# Patient Record
Sex: Female | Born: 1980 | Race: Black or African American | Hispanic: No | Marital: Married | Smoking: Former smoker
Health system: Southern US, Community
[De-identification: ages and names within clinical notes are randomized; demographics above are authoritative.]

## PROBLEM LIST (undated history)

## (undated) ENCOUNTER — Inpatient Hospital Stay (HOSPITAL_COMMUNITY): Payer: Self-pay

## (undated) DIAGNOSIS — D649 Anemia, unspecified: Secondary | ICD-10-CM

## (undated) DIAGNOSIS — F419 Anxiety disorder, unspecified: Secondary | ICD-10-CM

## (undated) DIAGNOSIS — Z9109 Other allergy status, other than to drugs and biological substances: Secondary | ICD-10-CM

## (undated) DIAGNOSIS — B999 Unspecified infectious disease: Secondary | ICD-10-CM

## (undated) DIAGNOSIS — IMO0002 Reserved for concepts with insufficient information to code with codable children: Secondary | ICD-10-CM

## (undated) DIAGNOSIS — F329 Major depressive disorder, single episode, unspecified: Secondary | ICD-10-CM

## (undated) HISTORY — DX: Reserved for concepts with insufficient information to code with codable children: IMO0002

## (undated) HISTORY — PX: NO PAST SURGERIES: SHX2092

## (undated) HISTORY — DX: Unspecified infectious disease: B99.9

## (undated) HISTORY — PX: BARTHOLIN GLAND CYST EXCISION: SHX565

## (undated) HISTORY — DX: Anxiety disorder, unspecified: F41.9

## (undated) HISTORY — DX: Major depressive disorder, single episode, unspecified: F32.9

---

## 2009-02-05 DIAGNOSIS — IMO0002 Reserved for concepts with insufficient information to code with codable children: Secondary | ICD-10-CM

## 2009-02-05 HISTORY — DX: Reserved for concepts with insufficient information to code with codable children: IMO0002

## 2009-06-22 ENCOUNTER — Emergency Department (HOSPITAL_COMMUNITY): Admission: EM | Admit: 2009-06-22 | Discharge: 2009-06-22 | Payer: Self-pay | Admitting: Family Medicine

## 2009-07-05 ENCOUNTER — Emergency Department (HOSPITAL_COMMUNITY): Admission: EM | Admit: 2009-07-05 | Discharge: 2009-07-05 | Payer: Self-pay | Admitting: Emergency Medicine

## 2009-11-11 ENCOUNTER — Ambulatory Visit: Payer: Self-pay | Admitting: Family

## 2009-11-11 ENCOUNTER — Inpatient Hospital Stay (HOSPITAL_COMMUNITY): Admission: AD | Admit: 2009-11-11 | Discharge: 2009-11-11 | Payer: Self-pay | Admitting: Obstetrics & Gynecology

## 2010-02-05 DIAGNOSIS — F32A Depression, unspecified: Secondary | ICD-10-CM

## 2010-02-05 DIAGNOSIS — F419 Anxiety disorder, unspecified: Secondary | ICD-10-CM

## 2010-02-05 HISTORY — DX: Anxiety disorder, unspecified: F41.9

## 2010-02-05 HISTORY — DX: Depression, unspecified: F32.A

## 2010-02-05 NOTE — L&D Delivery Note (Signed)
Delivery Note At 5:58 PM a viable female was delivered via Vaginal, Spontaneous Delivery (Presentation: Right Occiput Anterior).  APGAR: 9, 9; weight 5 lb 9.8 oz (2545 g).   Placenta status: Intact, Spontaneous.  Cord: 3 vessels with the following complications: None.  Cord pH: N/a  Anesthesia: Epidural  Episiotomy: None Lacerations: 1st degree;Labial  No perineal lacerations. Suture Repair: None Est. Blood Loss (mL): 200  Mom to postpartum.  Baby to nursery-stable.  Corry Ihnen 10/28/2010, 2:11 AM

## 2010-02-19 ENCOUNTER — Inpatient Hospital Stay (HOSPITAL_COMMUNITY)
Admission: AD | Admit: 2010-02-19 | Discharge: 2010-02-19 | Payer: Self-pay | Source: Home / Self Care | Attending: Family Medicine | Admitting: Family Medicine

## 2010-02-20 LAB — POCT PREGNANCY, URINE: Preg Test, Ur: NEGATIVE

## 2010-02-22 LAB — WOUND CULTURE: Culture: NORMAL

## 2010-03-22 LAB — GC/CHLAMYDIA PROBE AMP, GENITAL
Chlamydia: NEGATIVE
Gonorrhea: NEGATIVE

## 2010-03-22 LAB — ANTIBODY SCREEN: Antibody Screen: NEGATIVE

## 2010-03-22 LAB — HIV ANTIBODY (ROUTINE TESTING W REFLEX): HIV: NONREACTIVE

## 2010-03-22 LAB — ABO/RH: RH Type: POSITIVE

## 2010-03-22 LAB — RPR: RPR: NONREACTIVE

## 2010-03-22 LAB — RUBELLA ANTIBODY, IGM: Rubella: IMMUNE

## 2010-04-21 ENCOUNTER — Inpatient Hospital Stay (HOSPITAL_COMMUNITY)
Admission: AD | Admit: 2010-04-21 | Discharge: 2010-04-21 | Disposition: A | Discharge status: Home / Self Care | Payer: 59 | Source: Ambulatory Visit | Attending: Obstetrics & Gynecology | Admitting: Obstetrics & Gynecology

## 2010-04-21 DIAGNOSIS — O2390 Unspecified genitourinary tract infection in pregnancy, unspecified trimester: Secondary | ICD-10-CM | POA: Insufficient documentation

## 2010-04-21 DIAGNOSIS — N751 Abscess of Bartholin's gland: Secondary | ICD-10-CM | POA: Insufficient documentation

## 2010-04-24 LAB — POCT PREGNANCY, URINE
Preg Test, Ur: NEGATIVE
Preg Test, Ur: NEGATIVE

## 2010-08-22 ENCOUNTER — Ambulatory Visit (HOSPITAL_BASED_OUTPATIENT_CLINIC_OR_DEPARTMENT_OTHER): Payer: 59 | Admitting: Psychology

## 2010-08-22 DIAGNOSIS — F4323 Adjustment disorder with mixed anxiety and depressed mood: Secondary | ICD-10-CM

## 2010-08-26 ENCOUNTER — Inpatient Hospital Stay (HOSPITAL_COMMUNITY)
Admission: AD | Admit: 2010-08-26 | Discharge: 2010-08-26 | Disposition: A | Discharge status: Home / Self Care | Payer: 59 | Source: Ambulatory Visit | Attending: Obstetrics and Gynecology | Admitting: Obstetrics and Gynecology

## 2010-08-26 ENCOUNTER — Encounter (HOSPITAL_COMMUNITY): Payer: Self-pay

## 2010-08-26 DIAGNOSIS — N750 Cyst of Bartholin's gland: Secondary | ICD-10-CM | POA: Insufficient documentation

## 2010-08-26 DIAGNOSIS — N751 Abscess of Bartholin's gland: Secondary | ICD-10-CM

## 2010-08-26 DIAGNOSIS — O2390 Unspecified genitourinary tract infection in pregnancy, unspecified trimester: Secondary | ICD-10-CM | POA: Insufficient documentation

## 2010-08-26 HISTORY — DX: Anemia, unspecified: D64.9

## 2010-08-26 LAB — URINALYSIS, ROUTINE W REFLEX MICROSCOPIC
Bilirubin Urine: NEGATIVE
Glucose, UA: NEGATIVE mg/dL
Hgb urine dipstick: NEGATIVE
Ketones, ur: >80 mg/dL — AB
Nitrite: POSITIVE — AB
Protein, ur: NEGATIVE mg/dL
Specific Gravity, Urine: 1.02 (ref 1.005–1.030)
Urobilinogen, UA: 1 mg/dL (ref 0.0–1.0)
pH: 7 (ref 5.0–8.0)

## 2010-08-26 LAB — URINE MICROSCOPIC-ADD ON

## 2010-08-26 MED ORDER — LIDOCAINE HCL 2 % EX GEL
Freq: Once | CUTANEOUS | Status: AC
  Administered 2010-08-26: 1 via TOPICAL
  Filled 2010-08-26: qty 20

## 2010-08-26 MED ORDER — HYDROCODONE-ACETAMINOPHEN 5-325 MG PO TABS: 1.0000 | ORAL_TABLET | Freq: Four times a day (QID) | ORAL | Status: AC | PRN

## 2010-08-26 MED ORDER — CEPHALEXIN 500 MG PO CAPS: 500.0000 mg | ORAL_CAPSULE | Freq: Three times a day (TID) | ORAL | Status: AC

## 2010-08-26 NOTE — Discharge Instructions (Signed)
Bartholin's Cyst And Abscess You have a Bartholin's gland cyst. Bartholin's glands produce mucus through small openings just outside the opening of the vagina. The mucus helps with lubrication around the vagina during sexual intercourse. If the duct becomes clogged, the gland will swell and cause a bulge on the inside of the vagina. If this becomes big enough, it can be seen and felt on the outside of the vagina as well. Sometimes, the swelling will shrink away by itself. However, if the cyst becomes infected, the Bartholin's cyst fills with pus and becomes more swollen, red and painful and becomes a Bartholin's abscess. This usually requires antibiotic treatment and surgical drainage. Sometimes, with minor surgery under local anesthesia, a small tube is placed in the cyst or abscess wall. This allows continued drainage for up to 6 weeks. Minor surgery can make a new opening to replace the clogged duct and help prevent future cysts or abscess. If the abscess occurs several times, a minor operation with local anesthesia is necessary to remove the Bartholin's gland completely or to make it drain better. Cutting open the gland and suturing the edges to make the opening of the gland bigger (marsupialization) may be needed and should usually be done by your obstetrician/gyncology physician. Antibiotics are usually prescribed for this condition. Take all antibiotics as prescribed. Make sure to finish them even if you are doing better. Take warm sitz baths for 20 minutes, 3 times a day. See your caregiver for follow-up care as recommended. SEEK MEDICAL CARE IF:  There is increasing pain, swelling and/or redness near the vagina.   There is vomiting or inability to tolerate medications.   You have an oral temperature above 102 F (38.9 C).   There is uncontrolled bleeding from the vagina.  Document Released: 03/01/2004 Document Re-Released: 04/18/2009 [facility] Patient Information 2011 [place], [place].

## 2010-08-26 NOTE — ED Provider Notes (Addendum)
History     Chief Complaint  Patient presents with  . Bartholin's Cyst   HPI Comments: Andrea Hanson is 30 y.o. AA at 27 weeks gest. She presents to Maternity with complaint of pain and swelling in the vaginal area. 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 Upon arrival the abscess began to drain a small amount.     The history is provided by the patient.    Past Medical History  Diagnosis Date  . Anemia     Past Surgical History  Procedure Date  . No past surgeries     No family history on file.  History  Substance Use Topics  . Smoking status: Former Games developer  . Smokeless tobacco: Never Used  . Alcohol Use: No    OB History    Grav Para Term Preterm Abortions TAB SAB Ect Mult Living   1               Review of Systems  Genitourinary: Positive for vaginal discharge.       [redacted] weeks gestation with abscess left bartholin gland.  All other systems reviewed and are negative.    Physical Exam  BP 112/65  Pulse 79  Temp(Src) 98 F (36.7 C) (Oral)  Resp 18  Ht 5' 5.5" (1.664 m)  Wt 141 lb (63.957 kg)  BMI 23.11 kg/m2  Physical Exam  Nursing note and vitals reviewed. Constitutional: She is oriented to person, place, and time. She appears well-developed and well-nourished.  Eyes: EOM are normal.  Neck: Neck supple.  Pulmonary/Chest: Effort normal.  Abdominal: Soft. There is no tenderness.       Gravid at [redacted] weeks gestation. Pt on EFM. Abdomen non tedner, size consistent with dates.  Genitourinary:    There is tenderness on the left labia. There is tenderness around the vagina.       Tender, swollen area with small amount of purulent drainage.    Musculoskeletal: Normal range of motion.  Neurological: She is alert and oriented to person, place, and time. No cranial nerve deficit.  Skin: Skin is warm and dry.    ED Course  Word catheter placed Date/Time: 08/26/2010 11:05 AM Performed by: Kerrie Buffalo Authorized by:  Arlyce Harman E Consent: Verbal consent obtained. Risks and benefits: risks, benefits and alternatives were discussed Consent given by: patient Patient understanding: patient states understanding of the procedure being performed Patient consent: the patient's understanding of the procedure matches consent given Procedure consent: procedure consent matches procedure scheduled Relevant documents: relevant documents present and verified Patient identity confirmed: verbally with patient, arm band and hospital-assigned identification number Time out: Immediately prior to procedure a "time out" was called to verify the correct patient, procedure, equipment, support staff and site/side marked as required. Preparation: Patient was prepped and draped in the usual sterile fashion. Local anesthesia used: yes Anesthesia: local infiltration Local anesthetic: topical anesthetic Patient sedated: no Patient tolerance: Patient tolerated the procedure well with no immediate complications.  INCISION AND DRAINAGE Date/Time: 08/26/2010 10:51 AM Performed by: Kerrie Buffalo Authorized by: Fortino Sic Consent: Verbal consent obtained. Risks and benefits: risks, benefits and alternatives were discussed Consent given by: patient Patient understanding: patient states understanding of the procedure being performed Patient consent: the patient's understanding of the procedure matches consent given Relevant documents: relevant documents present and verified Patient identity confirmed: verbally with patient, arm band and hospital-assigned identification number Time out: Immediately prior to procedure a "time out" was called  to verify the correct patient, procedure, equipment, support staff and site/side marked as required. Type: abscess Location: left bartholin's gland. Anesthesia: local infiltration Local anesthetic: topical anesthetic Patient sedated: no Scalpel size: 11 Needle gauge: 22 Incision type:  single straight Complexity: simple Drainage: bloody Drainage amount: moderate Wound treatment: drain placed Patient tolerance: Patient tolerated the procedure well with no immediate complications.    MDM Dx: Bartholin's abscess, Pregnancy Will treat with Keflex and pt. Will follow up with Dr. Neva Seat on Tuesday as scheduled. She will return here as needed.      [place], NP 08/26/10 [address] [place], NP 09/07/10 [id number]

## 2010-08-26 NOTE — Progress Notes (Signed)
Patient c/o Bartholin's cyst onset Thursday worse now, 29 weeks

## 2010-08-26 NOTE — ED Notes (Signed)
HNeese, NP at bedside, bartholin's cyst examined, pt voices understanding of need to plan ward catheter.

## 2010-08-26 NOTE — Progress Notes (Signed)
Pt has hx of bartholin's on left labia, was going to get gland removed when she found out she was pregnant. As pt got into MAU room, cyst ruptured.

## 2010-08-29 ENCOUNTER — Encounter (HOSPITAL_COMMUNITY): Payer: 59 | Admitting: Psychology

## 2010-09-07 ENCOUNTER — Encounter (HOSPITAL_COMMUNITY): Payer: 59 | Admitting: Psychology

## 2010-09-29 ENCOUNTER — Encounter (HOSPITAL_COMMUNITY): Payer: 59 | Admitting: Psychology

## 2010-10-20 LAB — STREP B DNA PROBE: GBS: POSITIVE

## 2010-10-26 ENCOUNTER — Encounter (HOSPITAL_COMMUNITY): Payer: Self-pay | Admitting: *Deleted

## 2010-10-26 ENCOUNTER — Inpatient Hospital Stay (HOSPITAL_COMMUNITY)
Admission: AD | Admit: 2010-10-26 | Discharge: 2010-10-26 | Disposition: A | Discharge status: Home / Self Care | Payer: 59 | Source: Ambulatory Visit | Attending: Obstetrics and Gynecology | Admitting: Obstetrics and Gynecology

## 2010-10-26 DIAGNOSIS — O479 False labor, unspecified: Secondary | ICD-10-CM | POA: Insufficient documentation

## 2010-10-26 HISTORY — DX: Other allergy status, other than to drugs and biological substances: Z91.09

## 2010-10-26 MED ORDER — BUTORPHANOL TARTRATE 2 MG/ML IJ SOLN
1.0000 mg | Freq: Once | INTRAMUSCULAR | Status: AC
  Administered 2010-10-26: 1 mg via INTRAMUSCULAR
  Filled 2010-10-26: qty 1

## 2010-10-26 MED ORDER — PROMETHAZINE HCL 25 MG/ML IJ SOLN
12.5000 mg | Freq: Once | INTRAMUSCULAR | Status: AC
  Administered 2010-10-26: 12.5 mg via INTRAMUSCULAR
  Filled 2010-10-26: qty 1

## 2010-10-26 NOTE — Progress Notes (Signed)
Dr. Dion Body put in orders for pain management. DC patient home following medication

## 2010-10-26 NOTE — Progress Notes (Signed)
Contractions started around 0530, getting closer and stronger.

## 2010-10-26 NOTE — Progress Notes (Signed)
Pt G1 at 37.6wks, having contractions all day.  Pt seen earlier today in MAU SVE 3cm.  Contractions are stronger and more intense now.

## 2010-10-26 NOTE — Progress Notes (Signed)
Pt was here earlier for labor eval. Pt presents back to MAU for labor evaluation.

## 2010-10-27 ENCOUNTER — Encounter (HOSPITAL_COMMUNITY): Payer: Self-pay | Admitting: Anesthesiology

## 2010-10-27 ENCOUNTER — Encounter (HOSPITAL_COMMUNITY): Payer: Self-pay

## 2010-10-27 ENCOUNTER — Inpatient Hospital Stay (HOSPITAL_COMMUNITY): Payer: 59 | Admitting: Anesthesiology

## 2010-10-27 ENCOUNTER — Inpatient Hospital Stay (HOSPITAL_COMMUNITY)
Admission: AD | Admit: 2010-10-27 | Discharge: 2010-10-30 | DRG: 775 | Disposition: A | Discharge status: Home / Self Care | Payer: 59 | Source: Ambulatory Visit | Attending: Obstetrics and Gynecology | Admitting: Obstetrics and Gynecology

## 2010-10-27 DIAGNOSIS — Z22330 Carrier of Group B streptococcus: Secondary | ICD-10-CM

## 2010-10-27 DIAGNOSIS — O99892 Other specified diseases and conditions complicating childbirth: Secondary | ICD-10-CM | POA: Diagnosis present

## 2010-10-27 LAB — RPR: RPR Ser Ql: NONREACTIVE

## 2010-10-27 LAB — CBC
MCHC: 33.5 g/dL (ref 30.0–36.0)
Platelets: 290 10*3/uL (ref 150–400)
RDW: 15.7 % — ABNORMAL HIGH (ref 11.5–15.5)

## 2010-10-27 MED ORDER — PRENATAL PLUS 27-1 MG PO TABS
1.0000 | ORAL_TABLET | Freq: Every day | ORAL | Status: DC
  Administered 2010-10-28 – 2010-10-29 (×2): 1 via ORAL
  Filled 2010-10-27 (×2): qty 1

## 2010-10-27 MED ORDER — PENICILLIN G POTASSIUM 5000000 UNITS IJ SOLR
5.0000 10*6.[IU] | Freq: Once | INTRAVENOUS | Status: DC
  Administered 2010-10-27: 5 10*6.[IU] via INTRAVENOUS
  Filled 2010-10-27: qty 5

## 2010-10-27 MED ORDER — SIMETHICONE 80 MG PO CHEW: 80.0000 mg | CHEWABLE_TABLET | ORAL | Status: DC | PRN

## 2010-10-27 MED ORDER — LIDOCAINE HCL 1.5 % IJ SOLN
INTRAMUSCULAR | Status: DC | PRN
  Administered 2010-10-27 (×2): 5 mL via EPIDURAL

## 2010-10-27 MED ORDER — IBUPROFEN 600 MG PO TABS
600.0000 mg | ORAL_TABLET | Freq: Four times a day (QID) | ORAL | Status: DC | PRN
  Filled 2010-10-27: qty 1

## 2010-10-27 MED ORDER — IBUPROFEN 600 MG PO TABS
600.0000 mg | ORAL_TABLET | Freq: Four times a day (QID) | ORAL | Status: DC
  Administered 2010-10-27 – 2010-10-30 (×11): 600 mg via ORAL
  Filled 2010-10-27 (×9): qty 1

## 2010-10-27 MED ORDER — ACETAMINOPHEN 325 MG PO TABS: 650.0000 mg | ORAL_TABLET | ORAL | Status: DC | PRN

## 2010-10-27 MED ORDER — PHENYLEPHRINE 40 MCG/ML (10ML) SYRINGE FOR IV PUSH (FOR BLOOD PRESSURE SUPPORT)
PREFILLED_SYRINGE | INTRAVENOUS | Status: AC
  Filled 2010-10-27: qty 5

## 2010-10-27 MED ORDER — ONDANSETRON HCL 4 MG PO TABS: 4.0000 mg | ORAL_TABLET | ORAL | Status: DC | PRN

## 2010-10-27 MED ORDER — EPHEDRINE 5 MG/ML INJ
INTRAVENOUS | Status: AC
  Filled 2010-10-27: qty 4

## 2010-10-27 MED ORDER — FENTANYL 2.5 MCG/ML BUPIVACAINE 1/10 % EPIDURAL INFUSION (WH - ANES)
14.0000 mL/h | INTRAMUSCULAR | Status: DC
  Administered 2010-10-27 (×2): 14 mL/h via EPIDURAL
  Filled 2010-10-27: qty 60

## 2010-10-27 MED ORDER — LANOLIN HYDROUS EX OINT: TOPICAL_OINTMENT | CUTANEOUS | Status: DC | PRN

## 2010-10-27 MED ORDER — ONDANSETRON HCL 4 MG/2ML IJ SOLN: 4.0000 mg | Freq: Four times a day (QID) | INTRAMUSCULAR | Status: DC | PRN

## 2010-10-27 MED ORDER — CITRIC ACID-SODIUM CITRATE 334-500 MG/5ML PO SOLN: 30.0000 mL | ORAL | Status: DC | PRN

## 2010-10-27 MED ORDER — FERROUS SULFATE 325 (65 FE) MG PO TABS
325.0000 mg | ORAL_TABLET | Freq: Two times a day (BID) | ORAL | Status: DC
  Administered 2010-10-28 – 2010-10-29 (×4): 325 mg via ORAL
  Filled 2010-10-27 (×4): qty 1

## 2010-10-27 MED ORDER — LACTATED RINGERS IV SOLN
INTRAVENOUS | Status: DC
  Administered 2010-10-27 (×2): via INTRAVENOUS

## 2010-10-27 MED ORDER — LACTATED RINGERS IV SOLN: 500.0000 mL | Freq: Once | INTRAVENOUS | Status: DC

## 2010-10-27 MED ORDER — OXYTOCIN 20 UNITS IN LACTATED RINGERS INFUSION - SIMPLE
125.0000 mL/h | Freq: Once | INTRAVENOUS | Status: DC
  Administered 2010-10-27: 999 mL/h via INTRAVENOUS

## 2010-10-27 MED ORDER — DIBUCAINE 1 % RE OINT: 1.0000 "application " | TOPICAL_OINTMENT | RECTAL | Status: DC | PRN

## 2010-10-27 MED ORDER — BUTORPHANOL TARTRATE 2 MG/ML IJ SOLN: 1.0000 mg | INTRAMUSCULAR | Status: DC | PRN

## 2010-10-27 MED ORDER — PHENYLEPHRINE 40 MCG/ML (10ML) SYRINGE FOR IV PUSH (FOR BLOOD PRESSURE SUPPORT)
80.0000 ug | PREFILLED_SYRINGE | INTRAVENOUS | Status: DC | PRN
  Filled 2010-10-27: qty 5

## 2010-10-27 MED ORDER — OXYCODONE-ACETAMINOPHEN 5-325 MG PO TABS
1.0000 | ORAL_TABLET | ORAL | Status: DC | PRN
  Administered 2010-10-28 (×2): 1 via ORAL
  Filled 2010-10-27 (×2): qty 1

## 2010-10-27 MED ORDER — ONDANSETRON HCL 4 MG/2ML IJ SOLN: 4.0000 mg | INTRAMUSCULAR | Status: DC | PRN

## 2010-10-27 MED ORDER — LIDOCAINE HCL (PF) 1 % IJ SOLN
30.0000 mL | INTRAMUSCULAR | Status: DC | PRN
  Filled 2010-10-27 (×2): qty 30

## 2010-10-27 MED ORDER — TETANUS-DIPHTH-ACELL PERTUSSIS 5-2.5-18.5 LF-MCG/0.5 IM SUSP
0.5000 mL | Freq: Once | INTRAMUSCULAR | Status: AC
  Administered 2010-10-28: 0.5 mL via INTRAMUSCULAR
  Filled 2010-10-27: qty 0.5

## 2010-10-27 MED ORDER — BENZOCAINE-MENTHOL 20-0.5 % EX AERO
INHALATION_SPRAY | CUTANEOUS | Status: AC
  Administered 2010-10-27: 1 via TOPICAL
  Filled 2010-10-27: qty 56

## 2010-10-27 MED ORDER — METHYLERGONOVINE MALEATE 0.2 MG/ML IJ SOLN: 0.2000 mg | INTRAMUSCULAR | Status: DC | PRN

## 2010-10-27 MED ORDER — MAGNESIUM HYDROXIDE 400 MG/5ML PO SUSP: 30.0000 mL | ORAL | Status: DC | PRN

## 2010-10-27 MED ORDER — DIPHENHYDRAMINE HCL 50 MG/ML IJ SOLN: 12.5000 mg | INTRAMUSCULAR | Status: DC | PRN

## 2010-10-27 MED ORDER — EPHEDRINE 5 MG/ML INJ
10.0000 mg | INTRAVENOUS | Status: DC | PRN
  Filled 2010-10-27: qty 4

## 2010-10-27 MED ORDER — BENZOCAINE-MENTHOL 20-0.5 % EX AERO
1.0000 "application " | INHALATION_SPRAY | CUTANEOUS | Status: DC | PRN
  Administered 2010-10-27: 1 via TOPICAL

## 2010-10-27 MED ORDER — OXYTOCIN BOLUS FROM INFUSION
500.0000 mL | Freq: Once | INTRAVENOUS | Status: DC
  Filled 2010-10-27: qty 500
  Filled 2010-10-27: qty 1000

## 2010-10-27 MED ORDER — FLEET ENEMA 7-19 GM/118ML RE ENEM: 1.0000 | ENEMA | RECTAL | Status: DC | PRN

## 2010-10-27 MED ORDER — FENTANYL 2.5 MCG/ML BUPIVACAINE 1/10 % EPIDURAL INFUSION (WH - ANES)
INTRAMUSCULAR | Status: AC
  Filled 2010-10-27: qty 60

## 2010-10-27 MED ORDER — OXYCODONE-ACETAMINOPHEN 5-325 MG PO TABS: 2.0000 | ORAL_TABLET | ORAL | Status: DC | PRN

## 2010-10-27 MED ORDER — PENICILLIN G POTASSIUM 5000000 UNITS IJ SOLR
2.5000 10*6.[IU] | INTRAVENOUS | Status: DC
  Administered 2010-10-27: 2.5 10*6.[IU] via INTRAVENOUS
  Filled 2010-10-27 (×7): qty 2.5

## 2010-10-27 MED ORDER — DIPHENHYDRAMINE HCL 25 MG PO CAPS
25.0000 mg | ORAL_CAPSULE | Freq: Four times a day (QID) | ORAL | Status: DC | PRN
  Administered 2010-10-29: 25 mg via ORAL
  Filled 2010-10-27: qty 1

## 2010-10-27 MED ORDER — SENNOSIDES-DOCUSATE SODIUM 8.6-50 MG PO TABS
2.0000 | ORAL_TABLET | Freq: Every day | ORAL | Status: DC
  Administered 2010-10-27: 2 via ORAL

## 2010-10-27 MED ORDER — METHYLERGONOVINE MALEATE 0.2 MG PO TABS: 0.2000 mg | ORAL_TABLET | ORAL | Status: DC | PRN

## 2010-10-27 MED ORDER — WITCH HAZEL-GLYCERIN EX PADS: 1.0000 "application " | MEDICATED_PAD | CUTANEOUS | Status: DC | PRN

## 2010-10-27 MED ORDER — LACTATED RINGERS IV SOLN: 500.0000 mL | INTRAVENOUS | Status: DC | PRN

## 2010-10-27 MED ORDER — OXYTOCIN 20 UNITS IN LACTATED RINGERS INFUSION - SIMPLE: 125.0000 mL/h | INTRAVENOUS | Status: DC | PRN

## 2010-10-27 NOTE — H&P (Signed)
Andrea Hanson is a 30 y.o. female presenting for Labor .  OB History    Grav Para Term Preterm Abortions TAB SAB Ect Mult Living   1 0 0 0 0 0 0 0 0 0      Past Medical History  Diagnosis Date  . Anemia   . Environmental allergies    Past Surgical History  Procedure Date  . Bartholin gland cyst excision   . No past surgeries    Family History: family history is not on file. Social History:  reports that she has quit smoking. She has never used smokeless tobacco. She reports that she does not drink alcohol or use illicit drugs. ROS negative except as stated in HPI  Allergies betadine and shellfish  Dilation: 6 Effacement (%): 100 Station: +1 Exam by:: Montez Morita, RNC Blood pressure 128/75, pulse 80, temperature 98.7 F (37.1 C), temperature source Oral, resp. rate 18, height 5\' 6"  (1.676 m), weight 70.308 kg (155 lb), SpO2 100.00%. @IPILAEXAM @ @PHYSEXAMBYAGE2 @  Prenatal labs: ABO, Rh: O/Positive/-- (02/15 0000) Antibody: Negative (02/15 0000) Rubella:   RPR: NON REACTIVE (09/21 1215)  HBsAg:    HIV: Non-reactive (02/15 0000)  GBS: Positive (09/14 0000)   Assessment/Plan: 38 wks 0 days Labor  GBS positive  Received penicillin Anticipate svd.. Dr. Dion Body covering this evening.    Andrea Hanson J. 10/27/2010, 5:07 PM

## 2010-10-27 NOTE — Anesthesia Procedure Notes (Signed)
Epidural Patient location during procedure: OB Start time: 10/27/2010 1:44 PM  Staffing Performed by: anesthesiologist   Preanesthetic Checklist Completed: patient identified, site marked, surgical consent, pre-op evaluation, timeout performed, IV checked, risks and benefits discussed and monitors and equipment checked  Epidural Patient position: sitting Prep: ChloraPrep and site prepped and draped Patient monitoring: continuous pulse ox and blood pressure Approach: midline Injection technique: LOR air and LOR saline  Needle:  Needle type: Tuohy  Needle gauge: 17 G Needle length: 9 cm Needle insertion depth: 5 cm cm Catheter type: closed end flexible Catheter size: 19 Gauge Catheter at skin depth: 10 cm Test dose: negative  Assessment Events: blood not aspirated, injection not painful, no injection resistance, negative IV test and no paresthesia  Additional Notes Patient identified.  Risk benefits discussed including failed block, incomplete pain control, headache, nerve damage, paralysis, blood pressure changes, nausea, vomiting, reactions to medication both toxic or allergic, and postpartum back pain.  Patient expressed understanding and wished to proceed.  All questions were answered.  Sterile technique used throughout procedure and epidural site dressed with sterile barrier dressing. No paresthesia or other complications noted.The patient did not experience any signs of intravascular injection such as tinnitus or metallic taste in mouth nor signs of intrathecal spread such as rapid motor block. Please see nursing notes for vital signs.

## 2010-10-27 NOTE — Anesthesia Preprocedure Evaluation (Signed)
Anesthesia Evaluation  Name, MR# and DOB Patient awake  General Assessment Comment  Reviewed: Allergy & Precautions, H&P , Patient's Chart, lab work & pertinent test results  Airway Mallampati: II TM Distance: >3 FB Neck ROM: full    Dental No notable dental hx.    Pulmonary  clear to auscultation  pulmonary exam normalPulmonary Exam Normal breath sounds clear to auscultation none    Cardiovascular regular Normal    Neuro/Psych Negative Neurological ROS  Negative Psych ROS  GI/Hepatic/Renal negative GI ROS  negative Liver ROS  negative Renal ROS        Endo/Other  Negative Endocrine ROS (+)      Abdominal   Musculoskeletal   Hematology negative hematology ROS (+)   Peds  Reproductive/Obstetrics (+) Pregnancy    Anesthesia Other Findings             Anesthesia Physical Anesthesia Plan  ASA: II  Anesthesia Plan: Epidural   Post-op Pain Management:    Induction:   Airway Management Planned:   Additional Equipment:   Intra-op Plan:   Post-operative Plan:   Informed Consent: I have reviewed the patients History and Physical, chart, labs and discussed the procedure including the risks, benefits and alternatives for the proposed anesthesia with the patient or authorized representative who has indicated his/her understanding and acceptance.     Plan Discussed with:   Anesthesia Plan Comments:         Anesthesia Quick Evaluation  

## 2010-10-28 LAB — CBC
HCT: 26.7 % — ABNORMAL LOW (ref 36.0–46.0)
MCH: 28.1 pg (ref 26.0–34.0)
MCHC: 34.1 g/dL (ref 30.0–36.0)
MCV: 82.4 fL (ref 78.0–100.0)
Platelets: 245 10*3/uL (ref 150–400)
RDW: 15.5 % (ref 11.5–15.5)
WBC: 11 10*3/uL — ABNORMAL HIGH (ref 4.0–10.5)

## 2010-10-28 NOTE — Anesthesia Postprocedure Evaluation (Signed)
  Anesthesia Post-op Note  Patient: Andrea Hanson  Procedure(s) Performed: * No procedures listed *  Patient Location: PACU and Mother/Baby  Anesthesia Type: Epidural  Level of Consciousness: awake, alert  and oriented  Airway and Oxygen Therapy: Patient Spontanous Breathing   Post-op Assessment: Patient's Cardiovascular Status Stable and Respiratory Function Stable  Post-op Vital Signs: stable  Complications: No apparent anesthesia complications

## 2010-10-28 NOTE — Discharge Summary (Signed)
Obstetric Discharge Summary Reason for Admission: onset of labor Prenatal Procedures: ultrasound Intrapartum Procedures: spontaneous vaginal delivery Postpartum Procedures: none Complications-Operative and Postpartum:   ANAPHyLACTIC REACTION, ETIOLOGY UNKNOWN           Rapid response team managed pt.  Given IV Prednisone, Benadryl, Famotidine x 24 hours Hemoglobin  Date Value Range Status  10/27/2010 10.4* 12.0-15.0 (g/dL) Final     HCT  Date Value Range Status  10/27/2010 31.0* 36.0-46.0 (%) Final    Discharge Diagnoses: Term Pregnancy-delivered  Discharge Information: Date: 10/28/2010 Activity: pelvic rest Diet: routine Medications: PNV, Ibuprophen and Iron, Epipen Condition: stable Instructions: refer to practice specific booklet Discharge to: home  Follow-up:  [facility] in 2 weeks due to h/o Depression.  Allergy appt ASAP. Newborn Data: Live born female  Birth Weight: 5 lb 9.8 oz (2545 g) APGAR: 9, 9  Home with mother.  Andrea Hanson 10/28/2010, 2:28 AM

## 2010-10-28 NOTE — Progress Notes (Signed)
Post Partum Day 1 Subjective: no complaints Bleeding increases periodically, mostly with nursing.  Baby doing well.  Objective: Blood pressure 108/74, pulse 79, temperature 98.3 F (36.8 C), temperature source Oral, resp. rate 20, height 5\' 6"  (1.676 m), weight 70.308 kg (155 lb), SpO2 99.00%, unknown if currently breastfeeding.  Physical Exam:  General: alert and cooperative Lochia: appropriate Uterine Fundus: firm Incision: N/a DVT Evaluation: No evidence of DVT seen on physical exam.   Basename 10/28/10 0529 10/27/10 1215  HGB 9.1* 10.4*  HCT 26.7* 31.0*    Assessment/Plan: Plan for discharge tomorrow and Breastfeeding S/p NSVD doing well.   LOS: 1 day   Indiyah Paone 10/28/2010, 2:37 PM

## 2010-10-29 MED ORDER — HYDROCORTISONE SOD SUCCINATE 100 MG IJ SOLR
100.0000 mg | Freq: Three times a day (TID) | INTRAMUSCULAR | Status: AC
  Administered 2010-10-29 (×2): 100 mg via INTRAVENOUS
  Filled 2010-10-29 (×2): qty 2

## 2010-10-29 MED ORDER — HYDROCORTISONE SOD SUCCINATE 100 MG IJ SOLR
100.0000 mg | Freq: Once | INTRAMUSCULAR | Status: AC
  Administered 2010-10-29: 100 mg via INTRAVENOUS
  Filled 2010-10-29: qty 2

## 2010-10-29 MED ORDER — FAMOTIDINE IN NACL 20-0.9 MG/50ML-% IV SOLN
20.0000 mg | Freq: Once | INTRAVENOUS | Status: AC
  Administered 2010-10-29: 20 mg via INTRAVENOUS
  Filled 2010-10-29: qty 50

## 2010-10-29 MED ORDER — FAMOTIDINE IN NACL 20-0.9 MG/50ML-% IV SOLN
20.0000 mg | Freq: Four times a day (QID) | INTRAVENOUS | Status: AC
  Administered 2010-10-29 (×3): 20 mg via INTRAVENOUS
  Filled 2010-10-29 (×3): qty 50

## 2010-10-29 MED ORDER — DIPHENHYDRAMINE HCL 50 MG/ML IJ SOLN
INTRAMUSCULAR | Status: AC
  Administered 2010-10-29: 50 mg via INTRAVENOUS
  Filled 2010-10-29: qty 1

## 2010-10-29 MED ORDER — DIPHENHYDRAMINE HCL 50 MG/ML IJ SOLN
50.0000 mg | Freq: Four times a day (QID) | INTRAMUSCULAR | Status: AC
  Administered 2010-10-29 (×3): 50 mg via INTRAVENOUS
  Filled 2010-10-29 (×3): qty 1

## 2010-10-29 NOTE — Progress Notes (Signed)
  Lips remain swollen and eyelids edematous. Denies itching, rash, or difficulty swallowing.

## 2010-10-29 NOTE — Progress Notes (Signed)
Rapid Response call received at 0100.  Arrived to pt room at 01:04.  Pt noted to have significant facial swelling around nose, lips, and in arms/hands.  Pt then began to breakout in hives noted diffusely over upper torso, breasts, legs and feet.  Dr. Rodman Pickle (anesthesia) called to come assess pt.  Orders received.  After initial round of IV meds given, pt states she feels much better and hives dissipated.  Vital signs remain stable. Will follow-up prior to change of shift and in 12 hours.

## 2010-10-29 NOTE — Progress Notes (Signed)
Post Partum Day 2 Subjective: Overnight, pt experienced anaphalaxis.  Etiology unknown.  Significant swelling in face, hands.  Hives on upper torso.  Rapid response team called led by Dr. Rodman Pickle, Anesthesiologist.  Pt responded well to IV Benadryl, Prednisone and Famotidine.  Pt doing much better this am.  No ob complaints.  Objective: Blood pressure 111/78, pulse 86, temperature 98 F (36.7 C), temperature source Oral, resp. rate 18, height 5\' 6"  (1.676 m), weight 70.308 kg (155 lb), SpO2 98.00%, unknown if currently breastfeeding.  Physical Exam:  General: alert and cooperative Lochia: appropriate Uterine Fundus: firm Incision: N/a DVT Evaluation: No evidence of DVT seen on physical exam. HEENT:  Periorbital edema bilaterally.  Lips appear slightly swollen.  Basename 10/28/10 0529 10/27/10 1215  HGB 9.1* 10.4*  HCT 26.7* 31.0*    Assessment/Plan: Plan for discharge tomorrow Pt with anaphylactic reaction overnight. Unsure of etiology.Anesthesia recommends IV meds for 24 hours which will be up tomorrow around 1 am.   Will discharge home with Epi pen.  LOS: 2 days   Andrea Hanson 10/29/2010, 8:56 AM

## 2010-10-29 NOTE — Consults (Signed)
Called by Rapid Response to bedside in room 115 for patient with allergic reaction of unknown cause.  Patient is s/p SVD on 10/27/2010 and has received only motrin and percocet today, both of which she has received before without reaction. She is known to have a shellfish allergy and has eaten both take-out food and cafeteria food today.  She is complaining of throat tightness, but no dyspnea or dysphonia.  Has been given 25 mg PO Benadryl and does not have an IV.    Patient found to be in no acute distress, but with massive swelling of the face and hives over entire body surface.  BP 142/75, HR 126, spo2 100% on RA.  Opens mouth well, MP I airway, no tongue swelling.  Lungs are CTAB, heart is regular and tachycardiac.    I placed an 18 ga PIV in left hand.  Ordered 50 mg IV Benadryl, 20 mg IV famotidine and 100 mg IV hydrocortisone.  Patient showed improvement in swelling and hives within 5 minutes of IV benadryl dose, and reported decreased throat tightness .    Recommend continue benadryl and pepcid every 6 hours and hydrocortisone every 8 hours for the next 24 hours.    Jasmine December, MD

## 2010-10-30 MED ORDER — EPINEPHRINE 0.3 MG/0.3ML IJ DEVI: 0.3000 mg | Freq: Once | INTRAMUSCULAR | Status: DC

## 2010-10-30 MED ORDER — IBUPROFEN 600 MG PO TABS: 600.0000 mg | ORAL_TABLET | Freq: Four times a day (QID) | ORAL | Status: AC

## 2010-10-30 NOTE — Progress Notes (Addendum)
Post Partum Day 3 Subjective: no complaints Feels better.  H/o depression requiring meds.  Off meds about 2 years prior to getting pregnant. Objective: Blood pressure 124/86, pulse 87, temperature 98.1 F (36.7 C), temperature source Oral, resp. rate 18, height 5\' 6"  (1.676 m), weight 70.308 kg (155 lb), SpO2 98.00%, unknown if currently breastfeeding.  Physical Exam:  General: alert and cooperative Lochia: appropriate Uterine Fundus: firm Incision: N/a DVT Evaluation: No evidence of DVT seen on physical exam. Periorbital edema resolved.  Basename 10/28/10 0529 10/27/10 1215  HGB 9.1* 10.4*  HCT 26.7* 31.0*    Assessment/Plan: Discharge home and Breastfeeding H/o Depression- Recommend 2 week f/u at [facility].  PP depression precautions given. Anaphylactic reaction-Will attempt to get an appt for pt prior to discharge.   LOS: 3 days   Andrea Hanson 10/30/2010, 8:11 AM    Addendum:10/30/2010, 08:27-  Left message with Allergist on call.  Will check out to [facility] to make allergy consult/appointment.

## 2010-11-16 ENCOUNTER — Inpatient Hospital Stay (HOSPITAL_COMMUNITY)
Admission: AD | Admit: 2010-11-16 | Discharge: 2010-11-16 | Disposition: A | Discharge status: Home / Self Care | Payer: Medicaid Other | Source: Ambulatory Visit | Attending: Obstetrics and Gynecology | Admitting: Obstetrics and Gynecology

## 2010-11-16 DIAGNOSIS — IMO0001 Reserved for inherently not codable concepts without codable children: Secondary | ICD-10-CM

## 2010-11-16 DIAGNOSIS — L509 Urticaria, unspecified: Secondary | ICD-10-CM | POA: Insufficient documentation

## 2010-11-16 DIAGNOSIS — L500 Allergic urticaria: Secondary | ICD-10-CM

## 2010-11-16 MED ORDER — DIPHENHYDRAMINE HCL 50 MG/ML IJ SOLN
50.0000 mg | Freq: Once | INTRAMUSCULAR | Status: AC
  Administered 2010-11-16: 50 mg via INTRAVENOUS
  Filled 2010-11-16: qty 1

## 2010-11-16 MED ORDER — LACTATED RINGERS IV BOLUS (SEPSIS): 1000.0000 mL | Freq: Once | INTRAVENOUS | Status: DC

## 2010-11-16 MED ORDER — FAMOTIDINE IN NACL 20-0.9 MG/50ML-% IV SOLN
20.0000 mg | Freq: Once | INTRAVENOUS | Status: AC
  Administered 2010-11-16: 20 mg via INTRAVENOUS
  Filled 2010-11-16: qty 50

## 2010-11-16 MED ORDER — METHYLPREDNISOLONE SODIUM SUCC 125 MG IJ SOLR
125.0000 mg | Freq: Once | INTRAMUSCULAR | Status: AC
  Administered 2010-11-16: 125 mg via INTRAVENOUS
  Filled 2010-11-16: qty 2

## 2010-11-16 NOTE — Progress Notes (Signed)
Pt skin appears clear at this time. Hives have resolved. Pt denies itching at this time and is comfortable going home.

## 2010-11-16 NOTE — Progress Notes (Signed)
Pt states that rt before 2300-she started breaking out in welts all over tht itch-she took 3 Bendadryl-unsure if each was 25mg  or 12.5mg 

## 2010-11-16 NOTE — ED Provider Notes (Signed)
History   Pt presents today c/o an allergic reaction. She states she recently delivered a baby in September and had a severe allergic reaction while she was in the hospital. She is allergic to betadine and shellfish. She states she even saw an allergist after dc and was not told she was allergic to anything else. She states she began to break out in hives again today and is feeling "tight" in her chest. She denies fever or any other sx at this time.  Chief Complaint  Patient presents with  . Rash   HPI  OB History    Grav Para Term Preterm Abortions TAB SAB Ect Mult Living   1 1 1  0 0 0 0 0 0 1      Past Medical History  Diagnosis Date  . Anemia   . Environmental allergies     Past Surgical History  Procedure Date  . Bartholin gland cyst excision   . No past surgeries     No family history on file.  History  Substance Use Topics  . Smoking status: Former Games developer  . Smokeless tobacco: Never Used  . Alcohol Use: No    Allergies:  Allergies  Allergen Reactions  . Betadine (Povidone Iodine) Hives and Itching  . Shellfish Allergy Rash    Prescriptions prior to admission  Medication Sig Dispense Refill  . acetaminophen (TYLENOL) 500 MG tablet Take 1,000 mg by mouth daily as needed. For pain      . EPINEPHrine (EPIPEN) 0.3 mg/0.3 mL DEVI Inject 0.3 mLs (0.3 mg total) into the muscle once.  1 Device  1  . Fe Fum-FePoly-Vit C-Vit B3 (INTEGRA PO) Take 1 tablet by mouth 2 (two) times daily.       . hydrocortisone 1 % cream Apply 1 application topically 2 (two) times daily. Patient used this medication for itching bug bites.       . prenatal vitamin w/FE, FA (PRENATAL 1 + 1) 27-1 MG TABS Take 1 tablet by mouth daily.         Review of Systems  Constitutional: Negative for fever.  Respiratory: Negative for cough, hemoptysis, sputum production, shortness of breath and wheezing.   Cardiovascular: Negative for chest pain and palpitations.  Gastrointestinal: Negative for nausea,  vomiting, abdominal pain, diarrhea and constipation.  Genitourinary: Negative for dysuria, urgency, frequency and hematuria.  Skin: Positive for itching and rash.  Neurological: Negative for dizziness and headaches.  Psychiatric/Behavioral: Negative for depression and suicidal ideas.   Physical Exam   Blood pressure 157/95, pulse 110, temperature 98.4 F (36.9 C), resp. rate 20, height 5\' 6"  (1.676 m), weight 142 lb (64.411 kg), SpO2 99.00%, unknown if currently breastfeeding.  Physical Exam  Constitutional: She is oriented to person, place, and time. She appears well-developed and well-nourished. No distress.  HENT:  Head: Normocephalic and atraumatic.  Eyes: EOM are normal. Pupils are equal, round, and reactive to light.  Cardiovascular: Normal rate, regular rhythm and normal heart sounds.  Exam reveals no gallop and no friction rub.   No murmur heard. Respiratory: Effort normal and breath sounds normal. No respiratory distress. She has no wheezes. She has no rales. She exhibits no tenderness.  GI: Soft. She exhibits no distension. There is no tenderness. There is no rebound and no guarding.  Neurological: She is alert and oriented to person, place, and time.  Skin: Skin is warm and dry. Rash noted. Rash is urticarial. She is not diaphoretic. No erythema.  Pt with urticarial hives diffusively distributed over entire body.  Psychiatric: She has a normal mood and affect. Her behavior is normal. Judgment and thought content normal.    MAU Course  Procedures  Discussed pt with Dr. Paul Half. Will start IV steroids, benadryl, and pepcid.  Pt sx much improved following IV meds. Hives have now almost completely resolved. O2 sats have remained at 99% on room air. Lungs continue to remain CTAB.  Assessment and Plan  Allergic reaction: discussed with pt at length. She has an epi-pen which she will use if needed. She will return immediately or go to [facility] if her sx continue or worsen.  Discussed diet, activity, risks, and precautions.  [name]. Maxi Carreras III, DrHSc, MPAS, PA-C  11/16/2010, 12:20 AM   Henrietta Hoover, PA 11/16/10 [phone]

## 2010-11-21 ENCOUNTER — Encounter (INDEPENDENT_AMBULATORY_CARE_PROVIDER_SITE_OTHER): Payer: Medicaid Other | Admitting: Psychology

## 2010-11-21 DIAGNOSIS — F39 Unspecified mood [affective] disorder: Secondary | ICD-10-CM

## 2010-11-21 NOTE — ED Provider Notes (Signed)
Patient's manner of presentation d/w Andrea Hanson.  Patient noted to have urticarial rash s/p recent h/o anaphylaxis.  Steroids, pepcid, and benadryl given.  Review of above note reveals patient improved and was discharged.

## 2010-11-30 ENCOUNTER — Encounter (INDEPENDENT_AMBULATORY_CARE_PROVIDER_SITE_OTHER): Payer: Medicaid Other | Admitting: Psychology

## 2010-11-30 DIAGNOSIS — F4323 Adjustment disorder with mixed anxiety and depressed mood: Secondary | ICD-10-CM

## 2010-12-22 ENCOUNTER — Other Ambulatory Visit (HOSPITAL_COMMUNITY): Payer: Self-pay

## 2010-12-25 ENCOUNTER — Encounter (HOSPITAL_COMMUNITY): Payer: Self-pay | Admitting: Psychiatry

## 2010-12-25 ENCOUNTER — Ambulatory Visit (INDEPENDENT_AMBULATORY_CARE_PROVIDER_SITE_OTHER): Payer: 59 | Admitting: Psychiatry

## 2010-12-25 VITALS — BP 117/74 | HR 105 | Ht 64.0 in | Wt 132.6 lb

## 2010-12-25 DIAGNOSIS — F1010 Alcohol abuse, uncomplicated: Secondary | ICD-10-CM

## 2010-12-25 DIAGNOSIS — F1210 Cannabis abuse, uncomplicated: Secondary | ICD-10-CM

## 2010-12-25 DIAGNOSIS — F329 Major depressive disorder, single episode, unspecified: Secondary | ICD-10-CM

## 2010-12-25 MED ORDER — BUPROPION HCL ER (XL) 300 MG PO TB24: 300.0000 mg | ORAL_TABLET | Freq: Every day | ORAL | Status: DC

## 2010-12-25 NOTE — Patient Instructions (Signed)
You will start new dose of medication and stop alcohol ASAP. Please continue to see therapist on regular basis.
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
## 2011-01-05 ENCOUNTER — Ambulatory Visit (INDEPENDENT_AMBULATORY_CARE_PROVIDER_SITE_OTHER): Payer: Medicaid Other | Admitting: Psychology

## 2011-01-05 DIAGNOSIS — F329 Major depressive disorder, single episode, unspecified: Secondary | ICD-10-CM

## 2011-01-05 NOTE — Progress Notes (Signed)
   THERAPIST PROGRESS NOTE  Session Time: 1620 - 1730  Participation Level: Active  Behavioral Response: Well GroomedAlertIrritable  Type of Therapy: Individual Therapy  Treatment Goals addressed: Anger, Anxiety, Communication: Use of I statements. and Coping  Interventions: Strength-based, Assertiveness Training and Supportive  Summary: Andrea Hanson is a 30 y.o. female who presents with feelings of anger and irritation toward her husband.  She is juggling a full time job, real estate classes 3 nights a week, a new baby, and care of the house while he is not working and caring for the baby, but not in the way she would like.  On top of this, he blames her for their precarious financial situation because she did not pass her last real estate class (when she was 8-9 months pregnant and working and taking college courses).  She has insight into the reasons for being angry, but nevertheless feels it is so uncharacteristic of her that she is uncomfortable with it.  She has also been experiencing episodic anxiety for the past week or so.  We looked at what she has been doing and concluded that it may be the combination of Sudafed and Wellbutrin and caffeine that has caused the feelings.  She is feel less congested now, so is going to leave off the Sudafed and see if the problem is solved.  She has appt. Next week with Dr. Lolly Mustache and can bring up any side effects that remain with him then.  She normally has very rapid speech, but has good judgment about her baby and has tried to help her husband understand other ways to deal with her when she cries than putting her to bed and closing the door.  She recognizes his impulsivity and short attention span and is using that to help him see that the baby also has a short attention span and needs to have a variety of things to look at and frequent changes of location and position.  "I know he is a good Dad and I tell him that," but he really acts the proud  father when we are in public " and I feel like it looks like I don't do anything".    This week she blew up at him and was upset that she did that.  We talked about other ways to say what she feels, using I language, and avoiding an accusatory tone.  This is such a high energy and highly motivated couple, that they tend to rush into decisions and have very high expectations of themselves. She reports continuing emotional lability and crying at times, but her baby is only 2 months old, so this is not so unusual.    Suicidal/Homicidal: Nowithout intent/plan  Therapist Response: NA  Plan: Return again in 4 weeks.  Practice the communication skills we discussed and demonstrated.    Diagnosis: Axis I: Depressive Disorder NOS    Axis II: Deferred    Andrea Matsuo, RN 01/05/2011

## 2011-01-10 ENCOUNTER — Ambulatory Visit (HOSPITAL_COMMUNITY): Payer: 59 | Admitting: Psychiatry

## 2011-02-15 ENCOUNTER — Ambulatory Visit (HOSPITAL_COMMUNITY): Payer: Medicaid Other | Admitting: Psychology

## 2011-02-18 IMAGING — CR DG KNEE COMPLETE 4+V*L*
4 series · 4 of 4 positions shown · non-contrast
Comparison: None.

CLINICAL DATA: Twisting injury.  Left knee pain.

LEFT KNEE - COMPLETE 4+ VIEW

[t knee ap left]
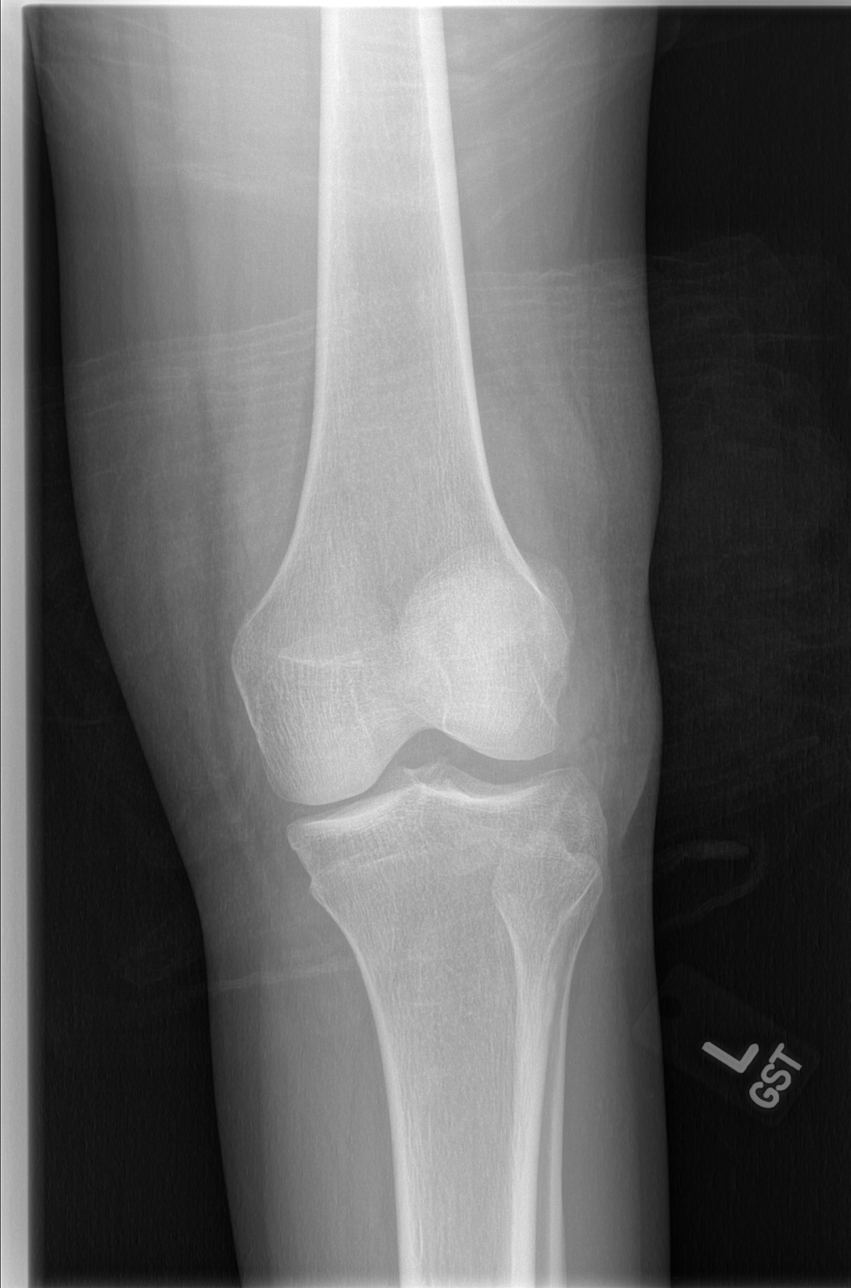

[t knee oblique left (1 of 2)]
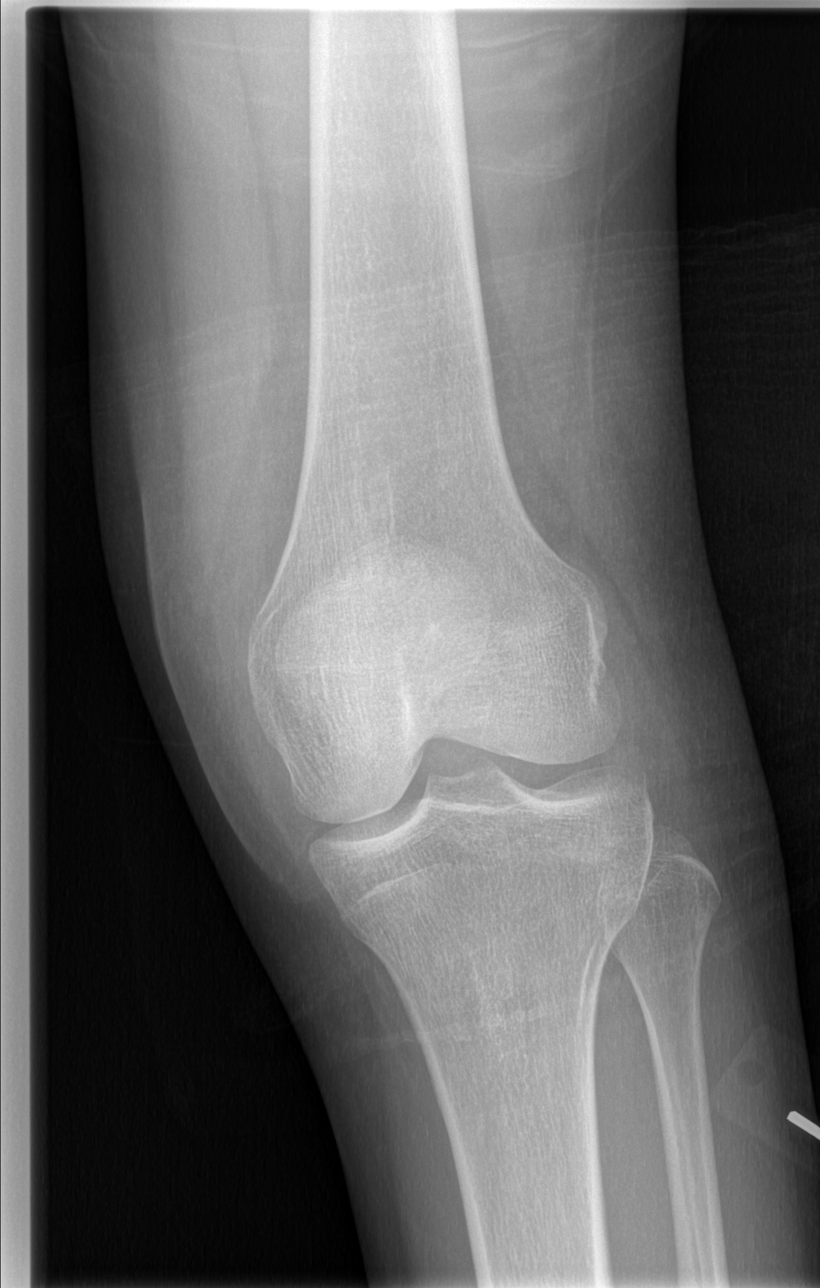

[t knee oblique left (2 of 2)]
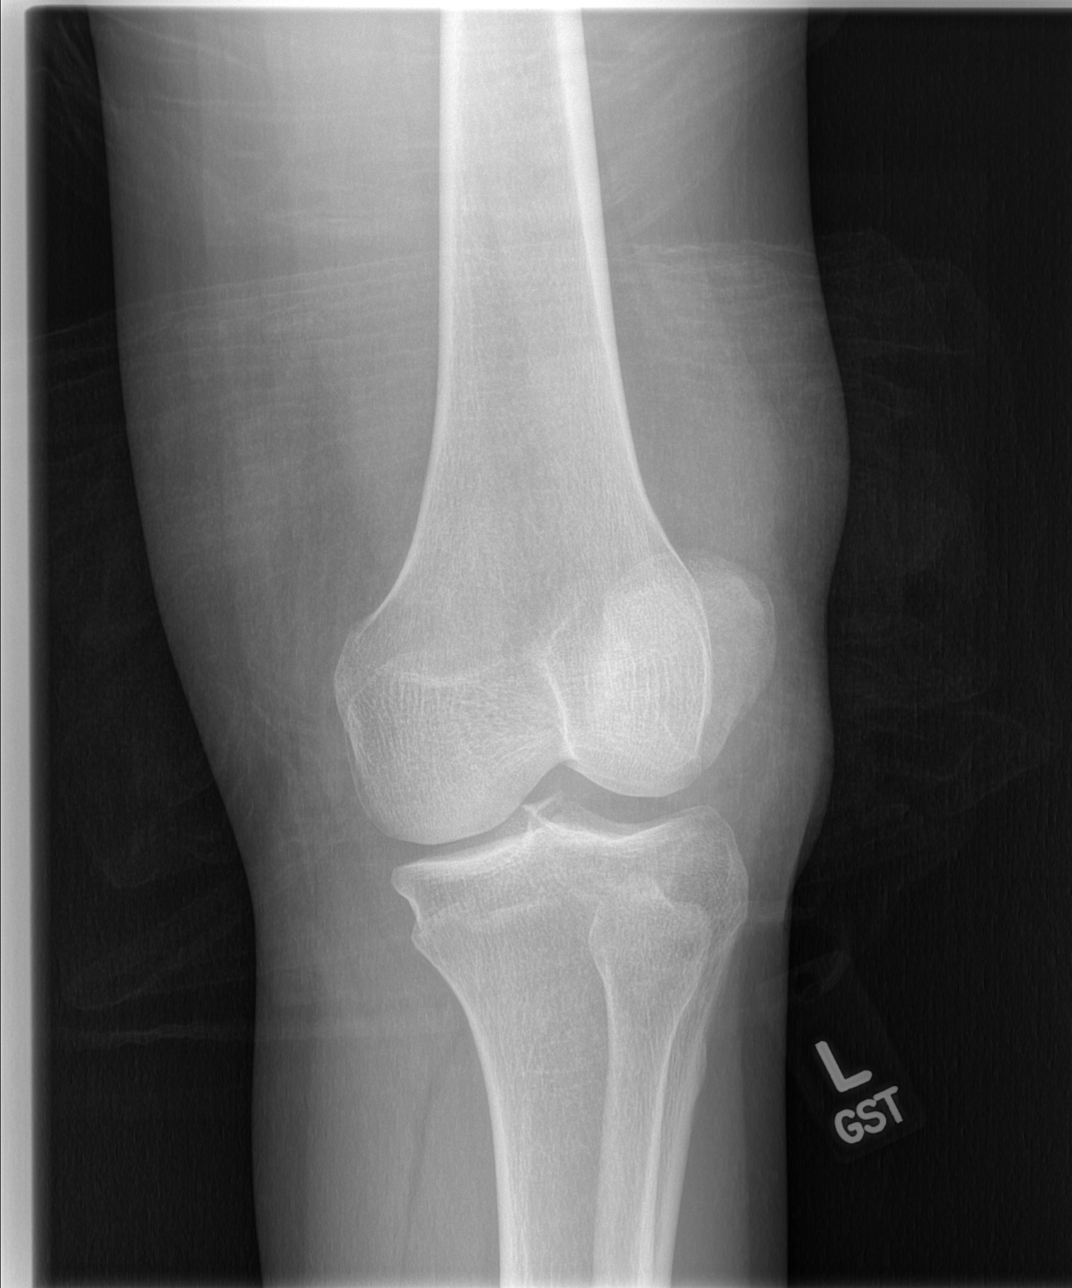

[t knee lat left]
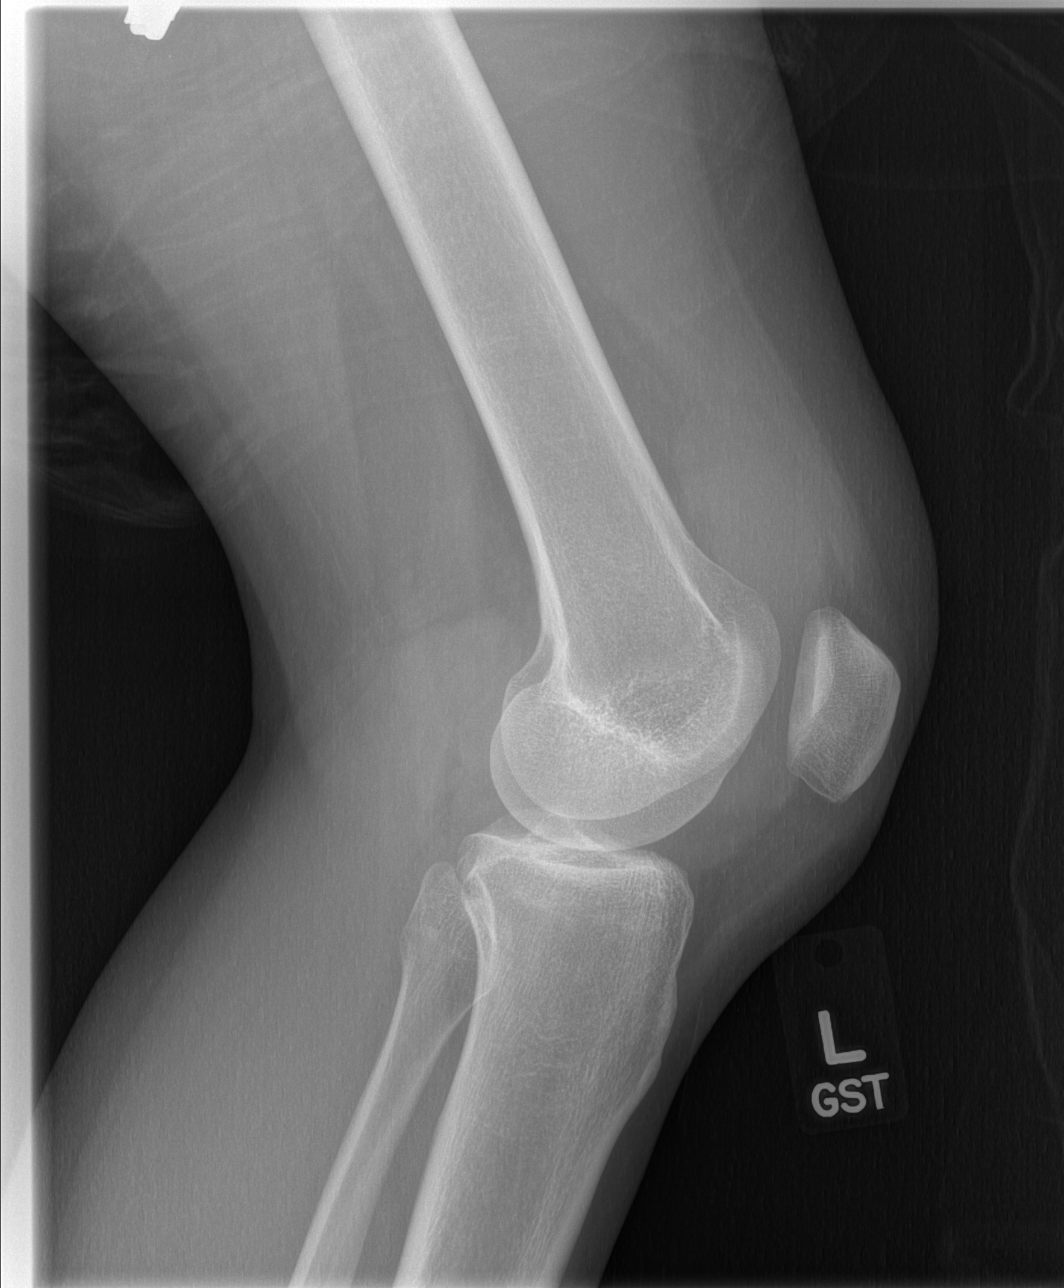

[4 of 4 positions shown; findings below may reference images not displayed]

FINDINGS: The patient has a large knee joint effusion.  No fracture
or dislocation is identified.
IMPRESSION: Large knee joint effusion.  No fracture.

## 2011-02-19 ENCOUNTER — Encounter (HOSPITAL_COMMUNITY): Payer: Self-pay | Admitting: Psychiatry

## 2011-02-19 ENCOUNTER — Ambulatory Visit (INDEPENDENT_AMBULATORY_CARE_PROVIDER_SITE_OTHER): Payer: Medicaid Other | Admitting: Psychiatry

## 2011-02-19 DIAGNOSIS — F1010 Alcohol abuse, uncomplicated: Secondary | ICD-10-CM

## 2011-02-19 DIAGNOSIS — F329 Major depressive disorder, single episode, unspecified: Secondary | ICD-10-CM

## 2011-02-19 MED ORDER — HYDROXYZINE PAMOATE 25 MG PO CAPS: 25.0000 mg | ORAL_CAPSULE | Freq: Every day | ORAL | Status: AC | PRN

## 2011-02-19 MED ORDER — BUPROPION HCL ER (XL) 300 MG PO TB24: 300.0000 mg | ORAL_TABLET | Freq: Every day | ORAL | Status: DC

## 2011-02-19 NOTE — Progress Notes (Signed)
Patient came for her followup appointment. She is taking Wellbutrin 300 mg daily. She endorsed less depressed and less tearful. She is sleeping good. She has a good Christmas. She has one episode of drink on New Year's however patient denies any intoxication. Overall her drinking has been cut down. She continues to endorse anxiety and nervousness. She is wondering if she can try some anxiety medication. She denies any agitation anger mood swings.  Mental status examination Patient is well groomed well dressed. She maintained good eye contact. Her speech is soft clear and coherent. Described her mood is anxious and her affect is mood congruent. There were no psychotic symptoms present. She denies any active or passive suicidal thoughts or homicidal thoughts. There were no hallucination or paranoid thinking present. She's alert and oriented x3. Her insight judgment and impulse control is okay.  Assessment Depressive disorder NOS Alcohol abuse  Plan Continue her Wellbutrin XL 300 mg daily. I will also add Vistaril 25 mg one a day for extreme anxiety. I explained risks and benefits of medication in detail. I also recommended to stop completely alcohol at that he be causing anxiety symptoms. She will continue to see therapist for increase coping and social skills. I will see her again in one month.

## 2011-03-19 ENCOUNTER — Ambulatory Visit (HOSPITAL_COMMUNITY): Payer: Medicaid Other | Admitting: Psychiatry

## 2011-06-04 ENCOUNTER — Other Ambulatory Visit (HOSPITAL_COMMUNITY): Payer: Self-pay | Admitting: *Deleted

## 2011-06-04 DIAGNOSIS — F329 Major depressive disorder, single episode, unspecified: Secondary | ICD-10-CM

## 2011-06-04 MED ORDER — BUPROPION HCL ER (XL) 300 MG PO TB24: 300.0000 mg | ORAL_TABLET | Freq: Every day | ORAL | Status: DC

## 2011-06-04 NOTE — Telephone Encounter (Signed)
Requested refill of Wellbutrin.Not seen since 02/19/11.No show for appt in February 2013.Per Dr.Arfeen, can refill for 30 day supply after appt made.Pt made appt for 06/08/11.

## 2011-06-08 ENCOUNTER — Encounter (HOSPITAL_COMMUNITY): Payer: Self-pay

## 2011-06-08 ENCOUNTER — Ambulatory Visit (HOSPITAL_COMMUNITY): Payer: Medicaid Other | Admitting: Psychiatry

## 2011-06-11 ENCOUNTER — Encounter (HOSPITAL_COMMUNITY): Payer: Self-pay

## 2011-06-18 ENCOUNTER — Ambulatory Visit (HOSPITAL_COMMUNITY): Payer: Medicaid Other | Admitting: Psychiatry

## 2011-06-18 ENCOUNTER — Telehealth (HOSPITAL_COMMUNITY): Payer: Self-pay

## 2011-06-18 NOTE — Telephone Encounter (Signed)
2:31pm 06/18/11 pt has now missed 3 appts - per provider the pt has been dismissed from the practice - termination letter mailed./sh

## 2011-09-07 ENCOUNTER — Inpatient Hospital Stay (HOSPITAL_COMMUNITY): Payer: BC Managed Care – PPO

## 2011-09-07 ENCOUNTER — Encounter (HOSPITAL_COMMUNITY): Payer: Self-pay

## 2011-09-07 ENCOUNTER — Inpatient Hospital Stay (HOSPITAL_COMMUNITY)
Admission: AD | Admit: 2011-09-07 | Discharge: 2011-09-07 | Disposition: A | Discharge status: Home / Self Care | Payer: BC Managed Care – PPO | Source: Ambulatory Visit | Attending: Obstetrics and Gynecology | Admitting: Obstetrics and Gynecology

## 2011-09-07 DIAGNOSIS — F1020 Alcohol dependence, uncomplicated: Secondary | ICD-10-CM | POA: Insufficient documentation

## 2011-09-07 DIAGNOSIS — F1010 Alcohol abuse, uncomplicated: Secondary | ICD-10-CM | POA: Diagnosis present

## 2011-09-07 DIAGNOSIS — O99310 Alcohol use complicating pregnancy, unspecified trimester: Secondary | ICD-10-CM | POA: Diagnosis present

## 2011-09-07 DIAGNOSIS — O99340 Other mental disorders complicating pregnancy, unspecified trimester: Secondary | ICD-10-CM | POA: Insufficient documentation

## 2011-09-07 DIAGNOSIS — IMO0002 Reserved for concepts with insufficient information to code with codable children: Secondary | ICD-10-CM

## 2011-09-07 DIAGNOSIS — Z3490 Encounter for supervision of normal pregnancy, unspecified, unspecified trimester: Secondary | ICD-10-CM

## 2011-09-07 DIAGNOSIS — O0930 Supervision of pregnancy with insufficient antenatal care, unspecified trimester: Secondary | ICD-10-CM | POA: Insufficient documentation

## 2011-09-07 DIAGNOSIS — F1210 Cannabis abuse, uncomplicated: Secondary | ICD-10-CM | POA: Clinically undetermined

## 2011-09-07 LAB — URINALYSIS, ROUTINE W REFLEX MICROSCOPIC
Glucose, UA: NEGATIVE mg/dL
Ketones, ur: NEGATIVE mg/dL
Leukocytes, UA: NEGATIVE
Nitrite: NEGATIVE
Specific Gravity, Urine: <1.005 — ABNORMAL LOW (ref 1.005–1.030)
pH: 6 (ref 5.0–8.0)

## 2011-09-07 LAB — COMPREHENSIVE METABOLIC PANEL
ALT: 11 U/L (ref 0–35)
CO2: 21 mEq/L (ref 19–32)
Calcium: 9.5 mg/dL (ref 8.4–10.5)
Chloride: 99 mEq/L (ref 96–112)
GFR calc Af Amer: >90 mL/min (ref >90)
GFR calc non Af Amer: >90 mL/min (ref >90)
Glucose, Bld: 81 mg/dL (ref 70–99)
Sodium: 134 mEq/L — ABNORMAL LOW (ref 135–145)
Total Bilirubin: 0.3 mg/dL (ref 0.3–1.2)

## 2011-09-07 LAB — CBC
Hemoglobin: 10.7 g/dL — ABNORMAL LOW (ref 12.0–15.0)
MCH: 28.5 pg (ref 26.0–34.0)
MCV: 83.2 fL (ref 78.0–100.0)
RBC: 3.75 MIL/uL — ABNORMAL LOW (ref 3.87–5.11)

## 2011-09-07 LAB — RAPID URINE DRUG SCREEN, HOSP PERFORMED: Benzodiazepines: NOT DETECTED

## 2011-09-07 NOTE — MAU Note (Signed)
Patient states she has had no prenatal care. Patient states she has been been drinking a lot of wine every day. States she went to [facility] to be committed for help and was sent to MAU for evaluation of the pregnancy first. When she does not drink she has to have marijuana to get through.

## 2011-09-07 NOTE — MAU Provider Note (Signed)
History     CSN: [id number]  Arrival date and time: 09/07/11 1159   None     No chief complaint on file.  HPI This is a 31 y.o. female at [redacted]w[redacted]d by LMP who presents requesting Medical clearance for detox from alcohol.  Has been drinking wine and wants to stop. Smokes MJ when not drinking. Is familiar with medical detox.  Willing to do Detox without meds if that is what they order. Has not obtained PNC yet, due to copay for private insurance. Is getting Medicaid also.    OB History    Grav Para Term Preterm Abortions TAB SAB Ect Mult Living   2 1 1  0 0 0 0 0 0 1      Past Medical History  Diagnosis Date  . Anemia   . Environmental allergies   . Asthma     Past Surgical History  Procedure Date  . Bartholin gland cyst excision   . No past surgeries     Family History  Problem Relation Age of Onset  . Depression Mother   . Alcohol abuse Father     History  Substance Use Topics  . Smoking status: Former Games developer  . Smokeless tobacco: Never Used  . Alcohol Use: Yes    Allergies:  Allergies  Allergen Reactions  . Betadine (Povidone Iodine) Hives and Itching  . Pollen Extract Other (See Comments)    Seasonal allergy  . Shellfish Allergy Rash    Prescriptions prior to admission  Medication Sig Dispense Refill  . acetaminophen (TYLENOL) 500 MG tablet Take 1,000 mg by mouth daily as needed. For pain      . buPROPion (WELLBUTRIN XL) 300 MG 24 hr tablet Take 1 tablet (300 mg total) by mouth daily.  30 tablet  0  . EPINEPHrine (EPIPEN) 0.3 mg/0.3 mL DEVI Inject 0.3 mLs (0.3 mg total) into the muscle once.  1 Device  1  . Fe Fum-FePoly-Vit C-Vit B3 (INTEGRA PO) Take 1 tablet by mouth 2 (two) times daily.       . hydrocortisone 1 % cream Apply 1 application topically 2 (two) times daily. Patient used this medication for itching bug bites.       . prenatal vitamin w/FE, FA (PRENATAL 1 + 1) 27-1 MG TABS Take 1 tablet by mouth daily.         Review of Systems    Constitutional: Negative for fever.  Gastrointestinal: Negative for nausea, vomiting and abdominal pain.  Psychiatric/Behavioral: Positive for substance abuse. Negative for suicidal ideas.    Denies leaking, bleeding or pain.   Physical Exam   Blood pressure 120/81, pulse 95, temperature 98.5 F (36.9 C), temperature source Oral, resp. rate 16, height 5' 4.25" (1.632 m), weight 137 lb 12.8 oz (62.506 kg), last menstrual period 05/02/2011, SpO2 99.00%.  Physical Exam  Constitutional: She is oriented to person, place, and time. She appears well-developed. No distress.  HENT:  Head: Normocephalic.  Cardiovascular: Normal rate.   Respiratory: Effort normal.  GI: Soft. She exhibits no distension and no mass. There is no tenderness. There is no rebound and no guarding.  Musculoskeletal: Normal range of motion.  Neurological: She is alert and oriented to person, place, and time.  Skin: Skin is warm and dry.  Psychiatric: She has a normal mood and affect.       Speech normal. Mood appears normal. Able to communicate effectively. Does not appear intoxicated.   Results for orders placed during the hospital encounter  of 09/07/11 (from the past 24 hour(s))  URINALYSIS, ROUTINE W REFLEX MICROSCOPIC     Status: Abnormal   Collection Time   09/07/11 12:50 PM      Component Value Range   Color, Urine YELLOW  YELLOW   APPearance CLEAR  CLEAR   Specific Gravity, Urine <1.005 (*) 1.005 - 1.030   pH 6.0  5.0 - 8.0   Glucose, UA NEGATIVE  NEGATIVE mg/dL   Hgb urine dipstick NEGATIVE  NEGATIVE   Bilirubin Urine NEGATIVE  NEGATIVE   Ketones, ur NEGATIVE  NEGATIVE mg/dL   Protein, ur NEGATIVE  NEGATIVE mg/dL   Urobilinogen, UA 0.2  0.0 - 1.0 mg/dL   Nitrite NEGATIVE  NEGATIVE   Leukocytes, UA NEGATIVE  NEGATIVE  CBC     Status: Abnormal   Collection Time   09/07/11  2:02 PM      Component Value Range   WBC 10.0  4.0 - 10.5 K/uL   RBC 3.75 (*) 3.87 - 5.11 MIL/uL   Hemoglobin 10.7 (*) 12.0 - 15.0  g/dL   HCT 14.7 (*) 82.9 - 56.2 %   MCV 83.2  78.0 - 100.0 fL   MCH 28.5  26.0 - 34.0 pg   MCHC 34.3  30.0 - 36.0 g/dL   RDW 13.0  86.5 - 78.4 %   Platelets 262  150 - 400 K/uL  COMPREHENSIVE METABOLIC PANEL     Status: Abnormal   Collection Time   09/07/11  2:02 PM      Component Value Range   Sodium 134 (*) 135 - 145 mEq/L   Potassium 3.6  3.5 - 5.1 mEq/L   Chloride 99  96 - 112 mEq/L   CO2 21  19 - 32 mEq/L   Glucose, Bld 81  70 - 99 mg/dL   BUN 5 (*) 6 - 23 mg/dL   Creatinine, Ser 6.96  0.50 - 1.10 mg/dL   Calcium 9.5  8.4 - 29.5 mg/dL   Total Protein 7.5  6.0 - 8.3 g/dL   Albumin 3.8  3.5 - 5.2 g/dL   AST 15  0 - 37 U/L   ALT 11  0 - 35 U/L   Alkaline Phosphatase 48  39 - 117 U/L   Total Bilirubin 0.3  0.3 - 1.2 mg/dL   GFR calc non Af Amer >90  >90 mL/min   GFR calc Af Amer >90  >90 mL/min   [place]:  18.1 weeks, FHR 165, EDC 02/07/11.  Amniotic fluid and placenta normal.   MAU Course  Procedures  Assessment and Plan  A:  SIUP at [redacted]w[redacted]d      Alcoholism      MJ use     No prenatal care  P:  Discharge       Medically stable for admission to [facility]       Drug Screen pending.  All other results complete.       Viable 18.1 week fetus       Patient will go to [facility] for assessment and treatment.        List of OB providers given.       Plan anatomy US when she gets OB care  [facility] 09/07/2011, 3:16 PM

## 2011-09-10 NOTE — MAU Provider Note (Signed)
Agree with above note.  Denya Buckingham 09/10/2011 10:17 AM

## 2011-09-25 ENCOUNTER — Encounter: Payer: Self-pay | Admitting: Obstetrics and Gynecology

## 2011-10-01 ENCOUNTER — Ambulatory Visit (INDEPENDENT_AMBULATORY_CARE_PROVIDER_SITE_OTHER): Payer: BC Managed Care – PPO | Admitting: Obstetrics and Gynecology

## 2011-10-01 DIAGNOSIS — Z331 Pregnant state, incidental: Secondary | ICD-10-CM

## 2011-10-01 DIAGNOSIS — O99340 Other mental disorders complicating pregnancy, unspecified trimester: Secondary | ICD-10-CM

## 2011-10-01 LAB — POCT URINALYSIS DIPSTICK
Leukocytes, UA: NEGATIVE
pH, UA: 5

## 2011-10-01 MED ORDER — VITAFOL-OB+DHA 65-1 & 250 MG PO MISC: 1.0000 | Freq: Every day | ORAL | Status: AC

## 2011-10-01 NOTE — Progress Notes (Signed)
NOB INTERVIEW. NO PRENATAL CARE EXCEPT HOSPITAL VISIT. WAS SEEN FOR U/S PRIOR TO SEEKING TX AT BEHAVIORAL HEALTH FOR ALCOHOL CONSUMPTION. HAS APPT THIS WEEK. EXPOSURE TO LIME DUST 09/27/2011. PT STATES HAS "TINGLING" AFTER URINATION.  CHEM 10 REVIEWED BY VL. URINE TO CULTURE. PT REQUESTS QUAD SCREEN

## 2011-10-02 ENCOUNTER — Other Ambulatory Visit (HOSPITAL_COMMUNITY): Payer: Self-pay | Admitting: Psychiatry

## 2011-10-02 ENCOUNTER — Ambulatory Visit (INDEPENDENT_AMBULATORY_CARE_PROVIDER_SITE_OTHER): Payer: BC Managed Care – PPO | Admitting: Obstetrics and Gynecology

## 2011-10-02 ENCOUNTER — Encounter: Payer: Self-pay | Admitting: Obstetrics and Gynecology

## 2011-10-02 VITALS — BP 98/62 | Wt 136.0 lb

## 2011-10-02 DIAGNOSIS — O99340 Other mental disorders complicating pregnancy, unspecified trimester: Secondary | ICD-10-CM

## 2011-10-02 DIAGNOSIS — Z3689 Encounter for other specified antenatal screening: Secondary | ICD-10-CM

## 2011-10-02 DIAGNOSIS — F1911 Other psychoactive substance abuse, in remission: Secondary | ICD-10-CM

## 2011-10-02 DIAGNOSIS — Z331 Pregnant state, incidental: Secondary | ICD-10-CM

## 2011-10-02 DIAGNOSIS — O0930 Supervision of pregnancy with insufficient antenatal care, unspecified trimester: Secondary | ICD-10-CM | POA: Insufficient documentation

## 2011-10-02 DIAGNOSIS — F329 Major depressive disorder, single episode, unspecified: Secondary | ICD-10-CM | POA: Insufficient documentation

## 2011-10-02 DIAGNOSIS — O26899 Other specified pregnancy related conditions, unspecified trimester: Secondary | ICD-10-CM

## 2011-10-02 DIAGNOSIS — N898 Other specified noninflammatory disorders of vagina: Secondary | ICD-10-CM

## 2011-10-02 DIAGNOSIS — F1210 Cannabis abuse, uncomplicated: Secondary | ICD-10-CM

## 2011-10-02 DIAGNOSIS — Z124 Encounter for screening for malignant neoplasm of cervix: Secondary | ICD-10-CM

## 2011-10-02 DIAGNOSIS — O99310 Alcohol use complicating pregnancy, unspecified trimester: Secondary | ICD-10-CM

## 2011-10-02 DIAGNOSIS — F1010 Alcohol abuse, uncomplicated: Secondary | ICD-10-CM

## 2011-10-02 DIAGNOSIS — J45909 Unspecified asthma, uncomplicated: Secondary | ICD-10-CM

## 2011-10-02 DIAGNOSIS — IMO0002 Reserved for concepts with insufficient information to code with codable children: Secondary | ICD-10-CM

## 2011-10-02 LAB — PRENATAL PANEL VII
HCT: 32.3 % — ABNORMAL LOW (ref 36.0–46.0)
HIV: NONREACTIVE
Hemoglobin: 11.1 g/dL — ABNORMAL LOW (ref 12.0–15.0)
Hepatitis B Surface Ag: NEGATIVE
Lymphocytes Relative: 22 % (ref 12–46)
Lymphs Abs: 1.1 10*3/uL (ref 0.7–4.0)
Monocytes Absolute: 0.5 10*3/uL (ref 0.1–1.0)
Monocytes Relative: 9 % (ref 3–12)
Neutro Abs: 3.4 10*3/uL (ref 1.7–7.7)
Neutrophils Relative %: 69 % (ref 43–77)
RBC: 3.88 MIL/uL (ref 3.87–5.11)
Rh Type: POSITIVE
WBC: 5 10*3/uL (ref 4.0–10.5)

## 2011-10-02 LAB — AFP, QUAD SCREEN
Age Alone: 1:755 {titer}
Curr Gest Age: 21.4 wks.days
INH: 131.3 pg/mL
MoM for INH: 0.61
MoM for hCG: 1.23
Osb Risk: 1:471 {titer}
Tri 18 Scr Risk Est: NEGATIVE
Trisomy 18 (Edward) Syndrome Interp.: 1:76500 {titer}

## 2011-10-02 LAB — POCT WET PREP (WET MOUNT)
Clue Cells Wet Prep Whiff POC: NEGATIVE
pH: 5.5

## 2011-10-02 MED ORDER — NITROFURANTOIN MONOHYD MACRO 100 MG PO CAPS: 100.0000 mg | ORAL_CAPSULE | Freq: Two times a day (BID) | ORAL | Status: AC

## 2011-10-02 NOTE — Progress Notes (Signed)
Patient ID: Andrea Hanson, female   DOB: 14-Mar-1980, 31 y.o.   MRN: [id number]  Andrea Hanson is here for a new obstetrical visit.  Hx of anxiety and depression on wellbutrin prior to pg plans f/o behavioral health today. Taking PNV. Stopped THC in March, some wine 3 weeks ago not frequent use [redacted]w[redacted]d contractions, SROM, vag bleeding, N,V,D, constipation, UTI s/s Fetal movement Headache, visual spots, epigastric pain, swelling   @IPILAPH @ OB History    Grav Para Term Preterm Abortions TAB SAB Ect Mult Living   2 1 1  0 0 0 0 0 0 1     Obstetric Comments   PP DEPRESSION; +GBS     Past Medical History  Diagnosis Date  . Environmental allergies   . Depression 2012    PP. TOOK MEDS  . Anxiety 2012  . Abnormal Pap smear 2011    REPEATED; LAST PAP 02/2010  . Asthma     CHILDHOOD  . Infection     UTI X1  . Infection     yeast x1  . Anemia     CHRONIC   Past Surgical History  Procedure Date  . Bartholin gland cyst excision   . No past surgeries    Family History: family history includes Alcohol abuse in her father; Asthma in her sister; Cancer (age of onset:43) in her maternal aunt; Cirrhosis in her father; Depression in her mother; Diabetes in her maternal aunts, maternal grandmother, mother, and sister; and Hypertension in her father, maternal aunt, and mother. Social History:  reports that she quit smoking about 2 years ago. Her smoking use included Cigarettes. She has never used smokeless tobacco. She reports that she drinks about 7 - 12 ounces of alcohol per week. She reports that she does not use illicit drugs.    Blood pressure 98/62, weight 136 lb (61.689 kg), last menstrual period 05/02/2011, unknown if currently breastfeeding.  Physical exam: Calm, no distress Alert, appropriate appearance for age. No acute distress HEENT Grossly normal Thyroid no masses, nodes or enlargement  lungs clear bilaterally, AP RRR, breasts bilaterally no masses, dimpling, drainage, abd  soft, gravid, nt, bowel sounds active no edema to lower extremities EGBUS and perineum wnl, normal hair distribution Vagina pink moist Cervix LTC no cerical motion tenderness white discharge appears adequate Fundal height 21 FHTS 150  Prenatal labs: ABO, Rh: O/POS/-- (08/26 1016) Antibody: NEG (08/26 1016) Rubella:  immune RPR: NON REAC (08/26 1016)  HBsAg: NEGATIVE (08/26 1016)  HIV: NON REACTIVE (08/26 1016)  GBS: Positive (09/14 0000)   Assessment/Plan: [redacted]w[redacted]d UTI Gc/chl" Wet prep: neg trich, hyphae neg, clue neg Pap: sent [place]: f/o US anatomy had 18 week [place] at [facility] not anatomy Genetic screen:  RX macrobid discussed, UDS today discussed, Collaboration with Dr. Pennie Rushing. [facility], Andrea Hanson 10/02/2011, 12:17 PM [name], CNM

## 2011-10-02 NOTE — Progress Notes (Signed)
Patient ID: Andrea Hanson, female   DOB: February 26, 1980, 31 y.o.   MRN: [id number] Hx of abnormal pap, requested records. [name], CNM

## 2011-10-02 NOTE — Progress Notes (Signed)
[redacted]w[redacted]d Pt c/o "depression". Pt having occ crying episodes and increased stress. No HI or SI. Pt will go see counselor today after visit to discuss @ [facility]. 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 Pt d/c Wellbutrin 05/2011 after founding out pregnant.

## 2011-10-03 ENCOUNTER — Ambulatory Visit (HOSPITAL_COMMUNITY): Payer: BC Managed Care – PPO | Admitting: Psychiatry

## 2011-10-03 LAB — DRUG SCREEN, URINE
Amphetamine Screen, Ur: NEGATIVE
Barbiturate Quant, Ur: NEGATIVE
Cocaine Metabolites: NEGATIVE
Marijuana Metabolite: POSITIVE — AB
Opiates: NEGATIVE

## 2011-10-03 LAB — CULTURE, OB URINE: Colony Count: 75000

## 2011-10-03 LAB — HEMOGLOBINOPATHY EVALUATION
Hemoglobin Other: 0 %
Hgb A2 Quant: 2.8 % (ref 2.2–3.2)
Hgb F Quant: 0 % (ref 0.0–2.0)

## 2011-10-04 ENCOUNTER — Encounter (HOSPITAL_COMMUNITY): Payer: Self-pay | Admitting: Obstetrics and Gynecology

## 2011-10-04 ENCOUNTER — Other Ambulatory Visit: Payer: Self-pay | Admitting: Obstetrics and Gynecology

## 2011-10-04 ENCOUNTER — Telehealth: Payer: Self-pay | Admitting: Obstetrics and Gynecology

## 2011-10-04 DIAGNOSIS — O99311 Alcohol use complicating pregnancy, first trimester: Secondary | ICD-10-CM

## 2011-10-04 NOTE — Telephone Encounter (Addendum)
TC to pt. Per [facility], pt scheduled for detailed U/S to evaluate for possible fetal alcohol syndrome and consult at [facility] 10/12/11. ROB appt R/S to 10/18/11 with DR SR (pt requests MD). Lab for CMP scheduled 10/11/11. Pt states went to [facility] 10/02/11 but was unable to be seen.  Soonest able to R/S is 10/17/11. Informed per [facility] that positive marijuana screen noted.  States when went to [facility] did inform nurse that had recently smoked to try to avoid alcohol.  Pt referred to Franchot Erichsen [facility] for further F/U and support. Pt verbalizes comprehension and is agreeable with plan.

## 2011-10-05 LAB — PAP IG, CT-NG, RFX HPV ASCU

## 2011-10-06 ENCOUNTER — Observation Stay (HOSPITAL_COMMUNITY)
Admission: AD | Admit: 2011-10-06 | Discharge: 2011-10-10 | Disposition: A | Discharge status: Home / Self Care | Payer: BC Managed Care – PPO | Source: Ambulatory Visit | Attending: Obstetrics & Gynecology | Admitting: Obstetrics & Gynecology

## 2011-10-06 ENCOUNTER — Encounter (HOSPITAL_COMMUNITY): Payer: Self-pay | Admitting: *Deleted

## 2011-10-06 DIAGNOSIS — D649 Anemia, unspecified: Secondary | ICD-10-CM | POA: Diagnosis present

## 2011-10-06 DIAGNOSIS — Z87898 Personal history of other specified conditions: Secondary | ICD-10-CM

## 2011-10-06 DIAGNOSIS — F1010 Alcohol abuse, uncomplicated: Secondary | ICD-10-CM | POA: Diagnosis present

## 2011-10-06 DIAGNOSIS — IMO0002 Reserved for concepts with insufficient information to code with codable children: Secondary | ICD-10-CM

## 2011-10-06 DIAGNOSIS — F1210 Cannabis abuse, uncomplicated: Secondary | ICD-10-CM | POA: Diagnosis present

## 2011-10-06 DIAGNOSIS — O99340 Other mental disorders complicating pregnancy, unspecified trimester: Principal | ICD-10-CM | POA: Insufficient documentation

## 2011-10-06 DIAGNOSIS — N390 Urinary tract infection, site not specified: Secondary | ICD-10-CM | POA: Insufficient documentation

## 2011-10-06 DIAGNOSIS — F329 Major depressive disorder, single episode, unspecified: Secondary | ICD-10-CM | POA: Diagnosis present

## 2011-10-06 DIAGNOSIS — O0930 Supervision of pregnancy with insufficient antenatal care, unspecified trimester: Secondary | ICD-10-CM

## 2011-10-06 DIAGNOSIS — F1510 Other stimulant abuse, uncomplicated: Secondary | ICD-10-CM | POA: Insufficient documentation

## 2011-10-06 DIAGNOSIS — F3289 Other specified depressive episodes: Secondary | ICD-10-CM | POA: Insufficient documentation

## 2011-10-06 DIAGNOSIS — O2390 Unspecified genitourinary tract infection in pregnancy, unspecified trimester: Secondary | ICD-10-CM | POA: Insufficient documentation

## 2011-10-06 LAB — CBC WITH DIFFERENTIAL/PLATELET
Basophils Absolute: 0 10*3/uL (ref 0.0–0.1)
Eosinophils Absolute: 0.1 10*3/uL (ref 0.0–0.7)
Eosinophils Relative: 2 % (ref 0–5)
HCT: 31 % — ABNORMAL LOW (ref 36.0–46.0)
Lymphocytes Relative: 25 % (ref 12–46)
Lymphs Abs: 2 10*3/uL (ref 0.7–4.0)
MCH: 28.6 pg (ref 26.0–34.0)
MCV: 82.9 fL (ref 78.0–100.0)
Monocytes Absolute: 0.6 10*3/uL (ref 0.1–1.0)
RDW: 14.1 % (ref 11.5–15.5)
WBC: 8.1 10*3/uL (ref 4.0–10.5)

## 2011-10-06 LAB — RAPID URINE DRUG SCREEN, HOSP PERFORMED
Amphetamines: POSITIVE — AB
Tetrahydrocannabinol: NOT DETECTED

## 2011-10-06 LAB — ETHANOL: Alcohol, Ethyl (B): 77 mg/dL — ABNORMAL HIGH (ref 0–11)

## 2011-10-06 MED ORDER — VITAMIN B-1 100 MG PO TABS
100.0000 mg | ORAL_TABLET | Freq: Every day | ORAL | Status: DC
  Administered 2011-10-07 – 2011-10-10 (×4): 100 mg via ORAL
  Filled 2011-10-06 (×5): qty 1

## 2011-10-06 MED ORDER — PRENATAL MULTIVITAMIN CH
1.0000 | ORAL_TABLET | Freq: Every day | ORAL | Status: DC
  Administered 2011-10-06 – 2011-10-10 (×5): 1 via ORAL
  Filled 2011-10-06 (×5): qty 1

## 2011-10-06 MED ORDER — LORAZEPAM 1 MG PO TABS
1.0000 mg | ORAL_TABLET | Freq: Four times a day (QID) | ORAL | Status: DC | PRN
  Administered 2011-10-06 – 2011-10-07 (×4): 1 mg via ORAL
  Filled 2011-10-06 (×5): qty 1

## 2011-10-06 MED ORDER — ACETAMINOPHEN 500 MG PO TABS: 1000.0000 mg | ORAL_TABLET | Freq: Every day | ORAL | Status: DC | PRN

## 2011-10-06 MED ORDER — PROMETHAZINE HCL 25 MG PO TABS
25.0000 mg | ORAL_TABLET | Freq: Four times a day (QID) | ORAL | Status: DC | PRN
  Administered 2011-10-07: 25 mg via ORAL
  Filled 2011-10-06: qty 1

## 2011-10-06 MED ORDER — DOCUSATE SODIUM 100 MG PO CAPS
100.0000 mg | ORAL_CAPSULE | Freq: Every day | ORAL | Status: DC
  Administered 2011-10-06 – 2011-10-10 (×4): 100 mg via ORAL
  Filled 2011-10-06 (×5): qty 1

## 2011-10-06 MED ORDER — LORAZEPAM 2 MG/ML IJ SOLN: 1.0000 mg | Freq: Four times a day (QID) | INTRAMUSCULAR | Status: DC | PRN

## 2011-10-06 MED ORDER — NITROFURANTOIN MONOHYD MACRO 100 MG PO CAPS
100.0000 mg | ORAL_CAPSULE | Freq: Two times a day (BID) | ORAL | Status: DC
  Administered 2011-10-06 – 2011-10-10 (×8): 100 mg via ORAL
  Filled 2011-10-06 (×11): qty 1

## 2011-10-06 MED ORDER — CALCIUM CARBONATE ANTACID 500 MG PO CHEW: 2.0000 | CHEWABLE_TABLET | ORAL | Status: DC | PRN

## 2011-10-06 MED ORDER — THIAMINE HCL 100 MG/ML IJ SOLN
100.0000 mg | Freq: Every day | INTRAMUSCULAR | Status: DC
  Filled 2011-10-06 (×3): qty 1

## 2011-10-06 MED ORDER — FOLIC ACID 1 MG PO TABS
1.0000 mg | ORAL_TABLET | Freq: Every day | ORAL | Status: DC
  Administered 2011-10-07 – 2011-10-10 (×4): 1 mg via ORAL
  Filled 2011-10-06 (×5): qty 1

## 2011-10-06 MED ORDER — ONDANSETRON 8 MG PO TBDP
8.0000 mg | ORAL_TABLET | Freq: Three times a day (TID) | ORAL | Status: DC | PRN
  Administered 2011-10-06: 8 mg via ORAL
  Filled 2011-10-06: qty 1

## 2011-10-06 NOTE — Progress Notes (Signed)
Patient ID: Andrea Hanson, female   DOB: July 31, 1980, 31 y.o.   MRN: [id number] I spoke with Dr Carney Bern (MFM).  He stated the ativan protocol is ok to use in this pregnant patient.  Awaiting the ACT team to find placement for the patient

## 2011-10-06 NOTE — H&P (Signed)
Andrea Hanson is a 31 y.o. female at 110w3d presenting for observation. Pt denies any ctx, VB, LOF or D/C. She is requesting to be admitted to [facility] for alcohol detox. She said she went there yesterday but wasn't about to be seen until appointment on 9-11. She had been seen by Dr Lolly Mustache at [facility], last visit in January.  She is here today w her husband and 72month old   HPI: pt began PNC at [facility] at 21.6 wks, she was seen in MAU and had [place] at approx 18wks. SHe had a pos drug screen for +THC.  Maternal Medical History:  Reason for admission: Observation for ACT team evaluation for pt ETOH use  Contractions: denies  Fetal activity: Perceived fetal activity is normal.   Last perceived fetal movement was within the past hour.    Prenatal complications: Substance abuse.   MJ and ETOH     OB History    Grav Para Term Preterm Abortions TAB SAB Ect Mult Living   2 1 1  0 0 0 0 0 0 1     Obstetric Comments   PP DEPRESSION; +GBS     Past Medical History  Diagnosis Date  . Environmental allergies   . Depression 2012    PP. TOOK MEDS  . Anxiety 2012  . Abnormal Pap smear 2011    REPEATED; LAST PAP 02/2010  . Asthma     CHILDHOOD  . Infection     UTI X1  . Infection     yeast x1  . Anemia     CHRONIC   Past Surgical History  Procedure Date  . Bartholin gland cyst excision   . No past surgeries    Family History: family history includes Alcohol abuse in her father; Asthma in her sister; Cancer (age of onset:43) in her maternal aunt; Cirrhosis in her father; Depression in her mother; Diabetes in her maternal aunts, maternal grandmother, mother, and sister; and Hypertension in her father, maternal aunt, and mother. Social History:  reports that she quit smoking about 2 years ago. Her smoking use included Cigarettes. She has never used smokeless tobacco. She reports that she drinks about 7 - 12 ounces of alcohol per week. She reports that she does not use illicit drugs.   Prenatal Transfer  Tool  Maternal Diabetes: No 22wks, 1hr gtt not done yet Genetic Screening: Normal Maternal Ultrasounds/Referrals: Normal pt has MFM [place] on 9/6 to evaluate for FAS  Fetal Ultrasounds or other Referrals:  Referred to Materal Fetal Medicine  Maternal Substance Abuse:  Yes:  Type: Marijuana, Other:  ETOH  Significant Maternal Medications:  None Significant Maternal Lab Results:  None Other Comments:  Pt admits to daily wine drinking daily  for approx last 3wks , +THC on 8-27   Review of Systems  Gastrointestinal: Negative for abdominal pain.  Neurological: Positive for speech change. Negative for loss of consciousness.  Psychiatric/Behavioral: Positive for depression and substance abuse. Negative for suicidal ideas and hallucinations. The patient is not nervous/anxious.   All other systems reviewed and are negative.      Blood pressure 123/79, pulse 119, temperature 99.3 F (37.4 C), temperature source Oral, resp. rate 16, height 5' 5.5" (1.664 m), weight 137 lb 6.4 oz (62.324 kg), last menstrual period 05/02/2011, SpO2 100.00%, unknown if currently breastfeeding. Maternal Exam:  Uterine Assessment: Soft   Abdomen: Patient reports no abdominal tenderness. Fundal height is aga.    Introitus: not evaluated.   Cervix: not evaluated.   Fetal  Exam Fetal Monitor Review: Mode: hand-held doppler probe.   Baseline rate: 160.      Physical Exam  Nursing note and vitals reviewed. Constitutional: She is oriented to person, place, and time. She appears well-developed and well-nourished. No distress.  HENT:  Head: Normocephalic.  Neck: Normal range of motion.  Cardiovascular: Regular rhythm and normal heart sounds.        Slight tachycardia   Respiratory: Effort normal and breath sounds normal.  GI: Soft. Bowel sounds are normal.  Musculoskeletal: Normal range of motion.  Neurological: She is alert and oriented to person, place, and time.       Speech normal, does not appear intoxicated   Skin: Skin is warm and dry.  Psychiatric: She has a normal mood and affect. Her behavior is normal.    Prenatal labs: ABO, Rh: O/POS/-- (08/26 1016) Antibody: NEG (08/26 1016) Rubella: 93.8 (08/26 1016) RPR: NON REAC (08/26 1016)  HBsAg: NEGATIVE (08/26 1016)  HIV: NON REACTIVE (08/26 1016)  GBS: Positive (09/14 0000)   Assessment/Plan: IUP at [redacted]w[redacted]d Acute ETOH use, will request for assistance and IP admission to [facility] Otherwise stable  ACT team notified C/W Dr Normand Sloop Labs drawn, CMP, drug screen and ethanol  Will admit to observation to 3rd floor and ACT team will come evaluate pt for further asessment   Jerrelle Michelsen M 10/06/2011, 1:47 PM

## 2011-10-06 NOTE — MAU Note (Signed)
Patient states she is trying to check herself in [facility] for alcohol detox. [facility] told the patient to go the emergency room to have a medical clearance done prior to admission. Patient denies any problems with the pregnancy. Denies any pain, bleeding or leaking and reports feeling movement.
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
## 2011-10-06 NOTE — Progress Notes (Signed)
Called BH and talked to Mr. Andrea Hanson (assessment dept.BH) to ask about bed placement for patient.  Was informed  that the psychiatrist Dr. Gabrielle Dare has not approved admission of this patient at [facility].  Called Hedwig Morton CNM and updated . Dr. Normand Sloop to see patient.

## 2011-10-07 DIAGNOSIS — F3289 Other specified depressive episodes: Secondary | ICD-10-CM

## 2011-10-07 DIAGNOSIS — F1210 Cannabis abuse, uncomplicated: Secondary | ICD-10-CM

## 2011-10-07 DIAGNOSIS — F1510 Other stimulant abuse, uncomplicated: Secondary | ICD-10-CM

## 2011-10-07 DIAGNOSIS — F329 Major depressive disorder, single episode, unspecified: Secondary | ICD-10-CM

## 2011-10-07 DIAGNOSIS — O99340 Other mental disorders complicating pregnancy, unspecified trimester: Principal | ICD-10-CM

## 2011-10-07 LAB — COMPREHENSIVE METABOLIC PANEL
ALT: 12 U/L (ref 0–35)
AST: 17 U/L (ref 0–37)
CO2: 26 mEq/L (ref 19–32)
Calcium: 9.2 mg/dL (ref 8.4–10.5)
Sodium: 133 mEq/L — ABNORMAL LOW (ref 135–145)
Total Protein: 6.9 g/dL (ref 6.0–8.3)
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
## 2011-10-08 MED ORDER — DIPHENOXYLATE-ATROPINE 2.5-0.025 MG PO TABS
1.0000 | ORAL_TABLET | ORAL | Status: DC | PRN
  Filled 2011-10-08: qty 1

## 2011-10-08 MED ORDER — DIPHENOXYLATE-ATROPINE 2.5-0.025 MG PO TABS
2.0000 | ORAL_TABLET | Freq: Once | ORAL | Status: AC
  Administered 2011-10-08: 2 via ORAL
  Filled 2011-10-08: qty 1

## 2011-10-08 MED ORDER — LORAZEPAM 2 MG/ML IJ SOLN: 1.0000 mg | Freq: Three times a day (TID) | INTRAMUSCULAR | Status: DC | PRN

## 2011-10-08 MED ORDER — TRAMADOL HCL 50 MG PO TABS
50.0000 mg | ORAL_TABLET | Freq: Four times a day (QID) | ORAL | Status: DC | PRN
  Administered 2011-10-09: 50 mg via ORAL
  Filled 2011-10-08: qty 1

## 2011-10-08 MED ORDER — LORAZEPAM 1 MG PO TABS
1.0000 mg | ORAL_TABLET | Freq: Four times a day (QID) | ORAL | Status: DC | PRN
  Administered 2011-10-08 – 2011-10-09 (×4): 1 mg via ORAL
  Filled 2011-10-08 (×4): qty 1

## 2011-10-08 NOTE — Progress Notes (Signed)
Ur chart review completed.  

## 2011-10-08 NOTE — Progress Notes (Signed)
Progress Note following Consultation: Dr. Sunny Schlein note appreciated.  Pt is 31 yo woman with one young child, treated for post-partum depression.  She began drinking and tolerance increased over time.  She came for detox from alcohol abuse in early stage of pregnancy.   Called Dr. Normand Sloop who says  Pt is on Ativan CIWA detox protocol and will remain until detox is completed.  Dr. Requests Tel. No. For IOP Detox Program. And it is given.  No further psychiatric needs unless  Requested.   MD Psychaitrist signs off  Andrea Hanson J. Andrea Luz, MD Psychiatrist  [phone] 10/08/2011 11:32 AM
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
## 2011-10-08 NOTE — Progress Notes (Signed)
SW spoke to Dr. Normand Sloop regarding recommendation for antidepressant.  She is in agreement and will discuss with psychiatry as to what to prescribe.  SW informed MD that SW will make referral for IOP tomorrow when offices reopen after the holiday.
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
## 2011-10-09 ENCOUNTER — Telehealth: Payer: Self-pay | Admitting: Obstetrics and Gynecology

## 2011-10-09 MED ORDER — LORAZEPAM 1 MG PO TABS: 1.0000 mg | ORAL_TABLET | Freq: Once | ORAL | Status: DC | PRN

## 2011-10-09 MED ORDER — LORAZEPAM 1 MG PO TABS
1.0000 mg | ORAL_TABLET | Freq: Two times a day (BID) | ORAL | Status: DC | PRN
  Administered 2011-10-09: 1 mg via ORAL
  Filled 2011-10-09: qty 1

## 2011-10-09 MED ORDER — BUPROPION HCL ER (XL) 150 MG PO TB24
150.0000 mg | ORAL_TABLET | Freq: Every day | ORAL | Status: DC
  Administered 2011-10-10: 150 mg via ORAL
  Filled 2011-10-09 (×2): qty 1

## 2011-10-09 NOTE — Progress Notes (Addendum)
Patient ID: Andrea Hanson, female   DOB: 1980/08/09, 31 y.o.   MRN: [id number]  Pt admitted over the weekend for alcohol detox.  Currently on q12 dosing of ativan.  Pt asking about what she does in the even of cravings when she goes home.  SW called this morning and said she is getting the pt into IOP treatment program.  I spoke with Dr. Ferol Luz this morning about initiating an antidepressant per pts discussion with social worker, recs and previously on wellbutrin.  Dr. Ferol Luz said she would come by to see pt and give recs.  Denies any issues currently re preg.  Filed Vitals:   10/09/11 1200  BP: 108/68  Pulse: 100  Temp: 98.5 F (36.9 C)  Resp: 18   Exam Lungs cta CV rrr Abd nt Ext no calf tenderness  A/P 22 6/7wks undergoing detox on ativan q12 with anticipation of d/c tomorrow although pt seems a little anxious about it and I am awaiting recs from psych, Dr. Ferol Luz. FHR needs to be checked today still, RN notified. Pt scheduled at high risk ob clinic on 10/18/11.  7:50p received call from Dr. Ferol Luz who plans to see pt first thing in the morning.  She rec wellbutrin 75mg  po qam (so that med remains in pts system and to avoid having to move to bid dosing, will start wellbutrin xl 150mg  poqd).  Will d/c ativan tomorrow as last dose.  She further recommended that the pt be started on standing dose of Latuda which is FDA approved and cat B jif pt develops mood swings and it may also be helpful to calm hallucinations.  Will hold on starting now and just start the wellbutrin and obs fro s/sxs as per recs.  If pt develops extrapyramidal side effects (muscle tension, restlessness, pacing...) while on latuda, she rec starting benzotropine 0.5mg  BID prn.  She said in light of pts questions regarding cravings, she recs inpt treatment for a time until pt is stable enough to go home because currently she is at very high risk for relapse.  We will notify SW Jill Side [phone]?) of this change in rec in the  morning and try to have that arranged.  Will appreciate any additional input from psych after visit in am.
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
## 2011-10-09 NOTE — Telephone Encounter (Signed)
Pt scheduled at [facility] 10/18/11 at 9:30 am.

## 2011-10-09 NOTE — Progress Notes (Signed)
UR Chart review completed.  

## 2011-10-09 NOTE — Progress Notes (Addendum)
SW reviewed MD's note and spoke to the patient who states she is waiting on Psychiatry to come back before she can be discharged.  Patient can discharge today if the psychiatrist sees her today.  SW reviewed situation with Interior and spatial designer.  SW attempted to call Dr. Bogard/psychiatrist to ensure she is aware of this.  SW could not leave a message because her mailbox is full.  Patient is scheduled to start IOP on Thursday.  If she discharges today, she plans to call back to see if she can start tomorrow instead of Thursday.

## 2011-10-10 DIAGNOSIS — IMO0002 Reserved for concepts with insufficient information to code with codable children: Secondary | ICD-10-CM

## 2011-10-10 DIAGNOSIS — F1010 Alcohol abuse, uncomplicated: Secondary | ICD-10-CM

## 2011-10-10 MED ORDER — THIAMINE HCL 100 MG PO TABS: 100.0000 mg | ORAL_TABLET | Freq: Every day | ORAL | Status: AC

## 2011-10-10 MED ORDER — BUPROPION HCL ER (XL) 150 MG PO TB24: 150.0000 mg | ORAL_TABLET | Freq: Every day | ORAL | Status: DC
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
## 2011-10-10 NOTE — Progress Notes (Signed)
SW spoke to Ann/[facility] IOP counselor to discuss patient's hospitalization and upcoming rehab program.  She states that the insurance preauthorization needs to be completed by case manager.  SW passed this along to L. Careers adviser.  Patient socially ready for discharge.

## 2011-10-10 NOTE — Progress Notes (Signed)
Nurse care manager called insurance company [phone] and spoke to Sumner E. And reviewed clinical with her and patient's plan of intensive outpatient therapy at [facility] for 4 weeks /3 hours a day and is to start tomorrow on Thursday 10/11/11. She stated it is covered and no reference # or auth needed for this level of care (IOP) Intensive Outpatient Program.  Terrance Mass. Also faxed nurse care manager an additional resource through her insurance company called [employer] ([employer])- Intensive Case Management. This resource is provided for subscribers to utilize 24 hours a day  or education, resources and additional information  from a licensed Geophysicist/field seismologist. This information was given to patient with the  phone # [phone]  from the nurse care manager. CSW made aware also. No other needs at this time.

## 2011-10-10 NOTE — Progress Notes (Signed)
Progress Note prior to consultation Discussed pt. with  Dr. Su Hilt..  Pt is reported to have 'cravings for alcohol  There is a serious concern if she goes home to start outpatient IOP for rehab.  It is discussed with Dr. Su Hilt that a better plan to enhance probability of sobriety is to refer pt to inpatient rehab program.   RECOMMENDATION:  1.  Beyond good intentions this consult is postponed for 10/10/11 in am Discussed with Dr. Su Hilt 2.  Agreed with Dr. Su Hilt, begin Wellbutrin 150 mg daily.  3.  Consult scheduled in am. Carolena Fairbank J. Ferol Luz, MD Psychiatrist  10/10/2011 12:34 AM

## 2011-10-10 NOTE — Progress Notes (Signed)
Ur chart review completed.  

## 2011-10-10 NOTE — Progress Notes (Signed)
SW left message for Ann/[facility] IOP counselor.
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
## 2011-10-10 NOTE — Progress Notes (Signed)
SW spoke with Dr. Velva Harman who recommends inpatient tx.  SW agrees that a more restrictive environment such as inpatient tx sets up patient for a better outcome, but patient has not been open to this for the past two days.  Dr. Ferol Luz state patient is agreeable to inpatient tx at this time.  SW met with patient who is still unsure if she is willing to go to inpatient tx.  SW again discussed the pros and cons and assisted patient in weighing her options.  SW informed patient that SW agrees with psychiatrist's recommendation for inpatient, but that patient has the ability to make a decision for herself.  Patient still unsure and continues to say she would rather try IOP first to see if it is intensive enough.  SW asked if patient if she will agree to allowing SW to see what is available at this time, keeping in mind that it may be anywhere in the state.  She is willing to look at her options.  SW researching bed availability at this time and will follow up with patient and medical team.

## 2011-10-10 NOTE — Progress Notes (Signed)
Addendum: Nurse Care Manager spoke to [place] [employer] and patient is now enrolled in this Arts development officer) Case Management Program. Patient is scheduled to have a Nurse Case Manager -Fannie Knee call her tomorrow at home at 1030 and will start to follow patient.  This is a separate program from the IOP- Intensive outpatient program at [facility] in which the patient has been in contact with Dewayne Hatch the counselor and will start services there tomorrow at 1pm Thursday 10/11/11.

## 2011-10-10 NOTE — Consult Note (Signed)
Patient Identification:  Andrea Hanson Date of Evaluation:  10/10/2011. Reason for Consult:  Alcohol Dependence  Referring Provider:  Dr. Su Hanson History of Present Illness:Pt says she came to [facility] for detox from drinking too much alcohol when pregnant.  She is 20+wks gravida and says she began drinking ~` age 31 cocktails at first, then glasses of wine that has become progressive tolerance to drinking > bottle of wine daily.  She say she wants to go to inpatient rehab following discussion of pros and cons of IOP vs inpt. Programs.   Past Psychiatric History:she denies Si HI hx, She did not drink until after age 28-23.  She has smoked marijuana and does not say why her UDS is + for amphetamines.     Past Medical History:     Past Medical History  Diagnosis Date  . Environmental allergies   . Depression 2012    PP. TOOK MEDS  . Anxiety 2012  . Abnormal Pap smear 2011    REPEATED; LAST PAP 02/2010  . Asthma     CHILDHOOD  . Infection     UTI X1  . Infection     yeast x1  . Anemia     CHRONIC       Past Surgical History  Procedure Date  . Bartholin gland cyst excision   . No past surgeries     Allergies:  Allergies  Allergen Reactions  . Betadine (Povidone Iodine) Hives    Irritant contact dermatitis with epidermal necrosis resembling TENS  . Shellfish Allergy Anaphylaxis  . Pollen Extract Other (See Comments)    Seasonal allergy    Current Medications:  Prior to Admission medications   Medication Sig Start Date End Date Taking? Authorizing Provider  acetaminophen (TYLENOL) 500 MG tablet Take 1,000 mg by mouth daily as needed. For pain   Yes Historical Provider, MD  EPINEPHrine (EPIPEN) 0.3 mg/0.3 mL DEVI Inject 0.3 mLs (0.3 mg total) into the muscle once. 10/30/10  Yes Geryl Rankins, MD  nitrofurantoin, macrocrystal-monohydrate, (MACROBID) 100 MG capsule Take 1 capsule (100 mg total) by mouth 2 (two) times daily. 10/02/11 10/09/11 Yes [name], CNM  Prenatal  MV-Min-Fe Fum-FA-DHA (VITAFOL-OB+DHA) 65-1 & 250 MG MISC Take 1 tablet by mouth daily. 10/01/11  Yes [name], CNM    Social History:    reports that she quit smoking about 2 years ago. Her smoking use included Cigarettes. She has never used smokeless tobacco. She reports that she drinks about 7 - 12 ounces of alcohol per week. She reports that she does not use illicit drugs.   Family History:    Family History  Problem Relation Age of Onset  . Depression Mother   . Diabetes Mother   . Hypertension Mother   . Alcohol abuse Father   . Cirrhosis Father   . Hypertension Father   . Asthma Sister   . Diabetes Sister   . Diabetes Maternal Aunt   . Hypertension Maternal Aunt   . Diabetes Maternal Grandmother   . Cancer Maternal Aunt 43    BREAST  . Diabetes Maternal Aunt     Mental Status Examination/Evaluation: Objective:  Appearance: Fairly Groomed  Psychomotor Activity:  Normal  Eye Contact::  Fair  Speech:  Clear and Coherent and Slow  Volume:  Normal  Mood:  Anxious and Depressed  Affect:  Blunt, Congruent and Depressed  Thought Process:  Coherent, Relevant, Intact and guarded  Orientation:  Full  Thought Content:  Has cravings  Suicidal Thoughts:  No  Homicidal Thoughts:  No  Judgement:  Impaired  Insight:  Lacking   DIAGNOSIS:   AXIS I   Alcohol Dependence, r/o genetically based, anxiety, Depressed mood  AXIS II  Deffered  AXIS III See medical notes.  AXIS IV educational problems, occupational problems, other psychosocial or environmental problems, problems related to social environment and denial of severity of situation  AXIS V 51-60 moderate symptoms   Assessment/Plan: Discussed with Dr. Su Hanson, Andrea Hanson Pt is seen before breakfast.  She is sitting up and has spontaneous speech.  She says she researched alcohol and decided to come in for detox.  She denies any withdrawal sx now.  She is awake alert, oriented to person place situation, date.  She says  she has re-arranged her education plans due to this pregnancy.  She had been Consulting civil engineer on line, [employer]. And planned to transfer to a local campus in Jan. '14.  Now her EDC is Jan'14 and she will take a real estate course and get her license by then.  Later she plans to continue studies for psychology/social work.   She says she was born in [place], moved to [place] and now is in [place].  She is married to Andrea Hanson, mgr at [employer] and has a 1 yo daughter Andrea Hanson who goes to day care.  Her mother and father divorced when she was 56, after her birthday.  Her mother and sister live in [place] and her father drank alcohol and died of cirrhosis around the time of another one of her birthdays.  Her father's family were alcoholics.  Her mother did not drink. This pt is fully oriented.  She denies suicidal/homicidal thoughts, She says she has shown anger e.g. Hitting walls but says that is a result of alcohol influence. She recalls 2/3 items, calculates serial 7s slowly but accurately and interprets proverb abstractly.  Her judgment is impaired and insight is lacking when planning to attend IOP rehab.  She wants 'to do it on her own".  The risk of going to outpatient IOP rehab is clearly stated.  She volunteers that her husband had asked her to go to inpatient rehab.  The advantage of staying away from alcohol for a longer period of time to decrease the potency of 'triggers' is discussed.  She does think about her choices and says she will go to the inpatient rehab.   Andrea Side is called to the unit and is told of the pt's decision.  She names a rehab center near [place].   NB  As soon as MD confers with Psych CSW Andrea Hanson, pt has told Andrea Hanson that she just said she would go to inpatient so she could leave [facility] sooner. She has chosen a precarious plan that has the potential to corrupt her resolve to stop drinking.  This potentially places the fetus at greater risk of developing alcohol fetal syndrome were she to continue  drinking alcohol.  It will be important for her, her husband and any other source of support to make sure she and they do not buy any alcohol when she returns home.  RECOMMENDATION:  1.  Pt has capacity to make decisions about her health care but has chosen to opt for IOP rehab despite  MD  consultation's recommendation. 2.  Pt receives discussion of medication Wellbutrin purpose, side effects and reason to discuss any decision to stop medication with a physician.  Rx is discussed with Dr. Aundria Rud and Dr. Normand Sloop.  3.  Pt is  at high risk for Alcohol Dependence given the family history.  Suggest [place] for her husband and AA meetings for pt.  4.  No discrete mood disorder is identified and in view of pregnancy would not suggest additional medication. 5.  Pt is cognitively intact and suggest transfer to home as pt requests when medically stable.  6.  No further psychiatric needs identified.  MD Psychiatrist signs off.  Savva Beamer J. Ferol Luz, MD Psychiatrist  10/10/2011 2:25 PM

## 2011-10-10 NOTE — Progress Notes (Addendum)
SW has left message for three inpatient programs and awaiting return calls.  SW took application to patient for [facility], an inpatient program in [place].  The application and interview process can take multiple weeks, but SW recommended that patient start the process.  Patient states she has thought more about her options and spoke to her mother and husband and has decided to continue with her plan to start IOP at [facility] tomorrow.  Patient states she has discussed this with the counselor there/Ann and Dewayne Hatch would like to speak to SW.  Patient gives permission and SW will call Ann.  SW gave Mobile Crisis Management phone number for [facility], which patient can call at any time in a crisis situation.

## 2011-10-11 ENCOUNTER — Other Ambulatory Visit: Payer: BC Managed Care – PPO

## 2011-10-11 ENCOUNTER — Encounter (HOSPITAL_COMMUNITY): Payer: Self-pay | Admitting: Psychology

## 2011-10-11 NOTE — Progress Notes (Signed)
SW called patient to see how she did at home last night.  She sounded great and states she had a good night without the urge to drink.  She states she is getting ready for her meeting with the IOP counselor and is "excited."  SW will follow up with patient on any future admissions to [facility] as needed and at delivery.
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
## 2011-10-12 ENCOUNTER — Ambulatory Visit (HOSPITAL_COMMUNITY)
Admission: RE | Admit: 2011-10-12 | Discharge: 2011-10-12 | Disposition: A | Discharge status: Home / Self Care | Payer: BC Managed Care – PPO | Source: Ambulatory Visit | Attending: Obstetrics and Gynecology | Admitting: Obstetrics and Gynecology

## 2011-10-12 ENCOUNTER — Other Ambulatory Visit: Payer: BC Managed Care – PPO

## 2011-10-12 ENCOUNTER — Encounter (HOSPITAL_COMMUNITY): Payer: Self-pay

## 2011-10-12 ENCOUNTER — Encounter: Payer: BC Managed Care – PPO | Admitting: Obstetrics and Gynecology

## 2011-10-12 ENCOUNTER — Ambulatory Visit (HOSPITAL_COMMUNITY): Admission: RE | Admit: 2011-10-12 | Payer: BC Managed Care – PPO | Source: Ambulatory Visit

## 2011-10-12 ENCOUNTER — Other Ambulatory Visit: Payer: Self-pay | Admitting: Obstetrics and Gynecology

## 2011-10-12 VITALS — BP 118/72 | HR 82 | Wt 140.0 lb

## 2011-10-12 DIAGNOSIS — O355XX0 Maternal care for (suspected) damage to fetus by drugs, not applicable or unspecified: Secondary | ICD-10-CM | POA: Insufficient documentation

## 2011-10-12 DIAGNOSIS — D649 Anemia, unspecified: Secondary | ICD-10-CM

## 2011-10-12 DIAGNOSIS — Z1389 Encounter for screening for other disorder: Secondary | ICD-10-CM | POA: Insufficient documentation

## 2011-10-12 DIAGNOSIS — Z363 Encounter for antenatal screening for malformations: Secondary | ICD-10-CM | POA: Insufficient documentation

## 2011-10-12 DIAGNOSIS — F1010 Alcohol abuse, uncomplicated: Secondary | ICD-10-CM

## 2011-10-12 DIAGNOSIS — Z87898 Personal history of other specified conditions: Secondary | ICD-10-CM

## 2011-10-12 DIAGNOSIS — F1510 Other stimulant abuse, uncomplicated: Secondary | ICD-10-CM

## 2011-10-12 DIAGNOSIS — Z3689 Encounter for other specified antenatal screening: Secondary | ICD-10-CM

## 2011-10-12 DIAGNOSIS — IMO0002 Reserved for concepts with insufficient information to code with codable children: Secondary | ICD-10-CM

## 2011-10-12 DIAGNOSIS — O358XX0 Maternal care for other (suspected) fetal abnormality and damage, not applicable or unspecified: Secondary | ICD-10-CM | POA: Insufficient documentation

## 2011-10-12 NOTE — Progress Notes (Addendum)
Genetic Counseling  High-Risk Gestation Note  Appointment Date:  10/12/2011 Referred By: Esmeralda Arthur, MD Date of Birth:  06-11-1980    Pregnancy History: G2P1001 Estimated Date of Delivery: 02/07/12 Estimated Gestational Age: [redacted]w[redacted]d Attending: Particia Nearing, MD   I met with Ms. Andrea Hanson for genetic counseling given alcohol use earlier in pregnancy.  Ms. Andrea Hanson reported that she drank alcohol daily previously in pregnancy. She reported drinking 1-3 glasses of wine per day. She was admitted to [facility] for detox on 10/06/11, discharged 10/10/11. She reported that she has not had alcohol since that time and she is currently participating in an intensive outpatient program through [facility], three times a week from 1:00pm to 4:00 pm. She reported that she smoked marijuana occasionally during the pregnancy, and is not currently smoking marijuana. Ms. Andrea Hanson is taking Wellbutrin, which was prescribed following her hospital admission. She denied the use of additional drugs or tobacco. She denied significant viral illnesses during the course of her pregnancy.  Alcohol consumption during pregnancy has been associated with a number of birth defects including growth delays, small head size, heart defects, eye anomalies and facial differences as well as learning disabilities and behavioral problems.  The risk of these to occur tends to increase with the amount of alcohol consumed.  Studies estimate that approximately 6% of women who drink heavily throughout pregnancy will have babies with Fetal Alcohol Syndrome (FAS), which is characterized by the presence of all of the above mentioned problems.  However, we know that many more pregnancies of women who drink during pregnancy will have some (but not all) of these features.  Because there is no safe amount of alcohol consumption during pregnancy, it is recommended to completely avoid alcohol during pregnancy, which Andrea Hanson plans to do for the  remainder of the pregnancy.     A targeted ultrasound and fetal echocardiogram may help to detect growth delays or birth defects associated with alcohol use.  However, it is important to remember that not all birth defects can be identified prenatally. Additionally, we reviewed that targeted ultrasound and fetal echocardiogram can assess for structural and growth differences. However, many effects of FAS cannot be screened or tested for in pregnancy. We discussed that it would be helpful for her to inform the pediatrician of the alcohol exposure earlier in pregnancy so that the baby can be screened for potential associations and connected with available resources, if associated features of FAS are determined to be present.   Targeted ultrasound was performed at the time of today's visit. Complete ultrasound results reported separately. Follow-up ultrasound was planned for 11/09/11, and fetal echocardiogram was scheduled for 10/25/11.   The use of marijuana during pregnancy is associated with increased risk for low birth weight and premature delivery.  Therefore, it is recommended that the patient avoid smoking marijuana during pregnancy. We reviewed that available animal study data have not indicated an increased risk for birth defects with prenatal use of Wellbutrin (bupropion). Conflicting human study data exist regarding whether or not prenatal bupropion use has a possible association with congenital heart defects. However, we also reviewed the timing of the medication exposure and the timing of heart formation. Given that Wellbutrin was started recently in the pregnancy, this would not be expected to affect the risk for congenital heart disease in the current pregnancy. Even though a limited number of medicines are known to cause birth defects, we cannot say that it is completely safe to use any medicines during  pregnancy.  It is also not possible to predict any drug-drug interactions that occur, or how they  might affect a pregnancy.  The use of medications is recommended in pregnancy only if the benefit to the mother (and thus the pregnancy) outweighs potential risks to the baby.  Sometimes the maternal use of medications, dictated by a medical condition, may even be more beneficial to a pregnancy than not taking the medication(s) at all.  Both family histories were reviewed and found to be contributory for sickle cell trait in the father of the pregnancy. We reviewed with Andrea Hanson that a hemoglobin electrophoresis was performed previously for her and indicated the presence of normal hemoglobin, indicating that she does not have sickle cell trait or other hemoglobin variant. We reviewed the autosomal recessive inheritance of sickle cell anemia. Given that Andrea Hanson is not indicated to have sickle cell trait or other hemoglobin variant, the pregnancy is not at increased risk for sickle cell anemia, but has a 1 in 2 chance to inherit sickle cell trait.  Without further information regarding the provided family history, an accurate genetic risk cannot be calculated. Further genetic counseling is warranted if more information is obtained.  I counseled Ms. Andrea Hanson  regarding the above risks and available options.  The approximate face-to-face time with the genetic counselor was 25 minutes.  Quinn Plowman, MS Certified Genetic Counselor 10/12/2011

## 2011-10-15 ENCOUNTER — Other Ambulatory Visit (HOSPITAL_COMMUNITY): Payer: BC Managed Care – PPO | Attending: Psychiatry

## 2011-10-15 DIAGNOSIS — F1920 Other psychoactive substance dependence, uncomplicated: Secondary | ICD-10-CM | POA: Insufficient documentation

## 2011-10-16 ENCOUNTER — Encounter (HOSPITAL_COMMUNITY): Payer: Self-pay | Admitting: Psychology

## 2011-10-16 NOTE — Progress Notes (Signed)
Patient ID: Andrea Hanson, female   DOB: 1980-08-26, 31 y.o.   MRN: [id number] Treatment Planning Session: Met with the patient and her husband this afternoon. The purpose of the session had been to discuss the program, her goals for treatment, and to provided some education to her husband that will allow him to support his wife in a healthy manner. They had brought their 11 mo daughter, [name], with them. The patient's husband, Kevin Fenton, admitted that his wife's drinking had been a big concern for him and it has been the source of many discussions and arguments. He reported they were married in January of 2011 and were living in [place], [place]. He realized that she was drinking wine every night and he calculated how much money they were spending on alcohol. He noted that he was a social drinker and didn't desire to drink daily and was confused as he watched her drinking escalate. He complained about her drinking, but only recently recognized that she had been sneaking alcohol in the form of mini-bottles so he never knew how much she had actually consumed. We discussed the issue of trust and the importance of [name] regaining Jerome's trust through her actions and time. Kevin Fenton reported that when she is drinking she becomes "[name]", which is her middle name. He felt certain that the woman he married had not been the alcoholic woman, but he also admitted he sometimes wondered just who he had married. He even admitted he had quit his job because it entailed lots of travel and overnight trips and he decided he needed to be home. We discussed ways [name] could meet other women in recovery, including AA meetings, and she noted she was going to attend a meeting with another group member on Thursday evening. I encouraged her to seek out a church where she could go and meet other people who were trying to live honest drug free lives. The patient agreed that her goals of treatment included staying sober and building  support for her recovery. She also identified having a successful pregnancy and delivering a healthy baby girl as her 3rd treatment goal. The patient is 5 months pregnant and agreed to sign a consent for the [facility] at [facility] where she is going to receive her prenatal care and meet with physicians for the length of her pregnancy. The documentation was reviewed and the treatment plan completed accordingly. That patient and her husband both agreed to contact me with any questions or concerns. They also discussed couple's counseling and I agreed to refer them to a therapist here at [facility] outpatient services within the week. The patient has attended 2 group sessions and agreed that she would return tomorrow for group. She is scheduled to meet with the program director for her initial session on Friday, the 13th.

## 2011-10-17 ENCOUNTER — Ambulatory Visit (HOSPITAL_COMMUNITY): Payer: BC Managed Care – PPO | Admitting: Psychiatry

## 2011-10-17 ENCOUNTER — Other Ambulatory Visit (HOSPITAL_COMMUNITY): Payer: BC Managed Care – PPO | Admitting: Psychology

## 2011-10-17 DIAGNOSIS — F1920 Other psychoactive substance dependence, uncomplicated: Secondary | ICD-10-CM

## 2011-10-18 ENCOUNTER — Encounter: Payer: BC Managed Care – PPO | Admitting: Obstetrics and Gynecology

## 2011-10-18 ENCOUNTER — Encounter: Payer: BC Managed Care – PPO | Admitting: Family Medicine

## 2011-10-18 LAB — PRESCRIPTION ABUSE MONITORING 17P, URINE
Amphetamine/Meth: NEGATIVE ng/mL
Barbiturate Screen, Urine: NEGATIVE ng/mL
Benzodiazepine Screen, Urine: NEGATIVE ng/mL
Buprenorphine, Urine: NEGATIVE ng/mL
Cannabinoid Scrn, Ur: NEGATIVE ng/mL
Carisoprodol, Urine: NEGATIVE ng/mL
Cocaine Metabolites: NEGATIVE ng/mL
Fentanyl, Ur: NEGATIVE ng/mL
Meperidine, Ur: NEGATIVE ng/mL
Tapentadol, urine: NEGATIVE ng/mL
Tramadol Scrn, Ur: NEGATIVE ng/mL

## 2011-10-18 LAB — ALCOHOL METABOLITE (ETG), URINE: Ethyl Glucuronide (EtG): NEGATIVE ng/mL

## 2011-10-18 NOTE — Progress Notes (Incomplete)
    Daily Group Progress Note  Program: CD-IOP   Group Time: 1-2:30 pm  Participation Level: Minimal  Behavioral Response: Appropriate  Type of Therapy: Psycho-education Group  Topic: Self-Esteem: How Negative Core Beliefs Develop and are Maintained. A presentation was provided examining the development of one's self-esteem and how negative core beliefs can develop. Handouts were provided to group members. Self-esteem is formed in childhood and is influenced by many different experiences one has in her/his world. The formation of negative core beliefs and subsequent development of rules and behaviors to protect one from the activation of those beliefs was charted on the board. Members shared about their early experiences and the beliefs about themselves that developed as results of those experiences. The patterns of behaviors that many group members have followed based on those core beliefs were highlighted and made perfect sense to those reporting them. The session proved enlightening to many. Friday's group session will focus on how one can begin to change those core beliefs.   Group Time: 2:45- 4pm Participation Level: Active  Behavioral Response: Sharing  Type of Therapy: Process Group  Topic: Group Process: second half of group was spent in process. Two members admitted they had relapsed with one member explaining she had lied about it all week and had felt very guilty keeping the truth from the group. The other member seemed very discouraged and questioned whether she would ever stop using. Her disclosures included childhood experiences that served to form her negative core beliefs about self-worth and value. There was good disclosure among group members with very affirming feedback to those struggling.   Summary: The patient was attentive and was engaged in the group. She admitted she had not attended any AA meetings, but intends to go tomorrow night and meet another group member.     Family Program: Family present? {BHH YES OR NO:22294}   Name of family member(s): ***  UDS collected: {BHH YES OR NO:22294} Results: {Findings; urine drug screen:60936}  AA/NA attended?: {BHH YES OR NO:22294}{DAYS OF UJWJ:19147}  Sponsor?: {BHH YES OR NO:22294}   Lunah Losasso, LCAS

## 2011-10-19 ENCOUNTER — Other Ambulatory Visit (HOSPITAL_COMMUNITY): Payer: BC Managed Care – PPO | Admitting: Psychology

## 2011-10-21 NOTE — Progress Notes (Signed)
    Daily Group Progress Note  Program: CD-IOP   Group Time: 1-2:30 pm  Participation Level: Minimal  Behavioral Response: Sharing  Type of Therapy: Psycho-education Group  Topic: Positive Qualities Record: Beginning to Challenge the Negative Core Belief: first half of group spent in presentation about challenging one's negative core beliefs. The first step requires one identifying their "positive qualities". Group members were provided with a handout that included 12 different questions which required members to identify positive aspects of themselves. Members confirmed that they understood the development of negative core beliefs, but were eager to learn how to begin to change that. There was good disclosure and conversation among the group and all were able to identify positive qualities and characteristics that they possess.  Group Time: 2:45- 4pm  Participation Level: Active  Behavioral Response: Sharing  Type of Therapy: Process Group  Topic: Group Process/Graduation: the second half of group was spent in process. Members talked about their current frustrations and difficulties in early recovery. There was good feedback and sharing about how they have dealt with these commonly held experiences. As the session came to an end, a graduation ceremony was held for a successfully graduating member. There were brownies and kind words about the graduating member. The session proved very effective for all present.  Summary: The patient reported she had remained sober, but admitted she had not attended an AA meeting yesterday as she had promised to the group on Wednesday. She explained she had had a conflict. She identified being a "good mother" on her handout, but didn't share that in the discussion. She admitted that she has had problems communicating with her husband and described herself as 'passive', but is also very physical when she has been drinking. The patient was called out of the  group to meet with the medical director for their first session. She is making good progress and agreed that she hadn't been sober this long for the past 2+ years.    Family Program: Family present? No   Name of family member(s):   UDS collected: No Results:   AA/NA attended?: No  Sponsor?: No   Secily Walthour, LCAS

## 2011-10-22 ENCOUNTER — Encounter (HOSPITAL_COMMUNITY): Payer: Self-pay | Admitting: Psychology

## 2011-10-22 ENCOUNTER — Other Ambulatory Visit (HOSPITAL_COMMUNITY): Payer: BC Managed Care – PPO

## 2011-10-23 ENCOUNTER — Encounter (HOSPITAL_COMMUNITY): Payer: Self-pay | Admitting: Psychology

## 2011-10-23 NOTE — Progress Notes (Signed)
Patient ID: Andrea Hanson, female   DOB: Jul 24, 1980, 31 y.o.   MRN: [id number] The patient left a message stating that she was feeling sick to her stomach and would not be in group today. She will be excused from the CD-IOP group session this afternoon.

## 2011-10-24 ENCOUNTER — Other Ambulatory Visit (HOSPITAL_COMMUNITY): Payer: BC Managed Care – PPO | Admitting: Psychology

## 2011-10-24 NOTE — Progress Notes (Signed)
Patient ID: Andrea Hanson, female   DOB: 06/14/1980, 31 y.o.   MRN: [id number] CD-IOP - Individual Therapy Session: Met with the patient this afternoon. She had missed group yesterday and explained she had eaten some barbecue for lunch and was nauseous and had diarrhea. She had phoned me and left a voice mail message reporting she wouldn't be in group. Today she noted that she is feeling better. When asked about her husband, Andrea Hanson, she reported that they have been getting along better and he "sees that I am trying". The patient reported her daughter's 54 yo birthday is on Saturday and both his parents and her mother will be coming to town to celebrate. I asked her about her most recent Self-Inventory and she had admitted on the form that she felt like she might be able to drink sometime in the future. We discussed the chronic nature of the disease and I also wrote down the pros and cons of drinking as she identified them.  When I read them back to her, she smiled and admitted that the pros of drinking seemed trivial compared to the cons. The pros were based on how the alcohol made her feel - more confident and talkative, but nothing else. The patient admitted there was no way she could ever just have 1 glass of wine. We discussed triggers and she identified feelings as probably her biggest single trigger. She admitted she doesn't do well with change and her marriage, the birth of her child, and now her sudden second pregnancy are overwhelming at times. I noted that at least 2 of those changes were planned on her part and wondered whether that made them any easier to accept. She quetioned that. We discussed the patient's negative core beliefs and she agreed that she is very passive and it causes many problems for her. I provided some handouts and encouraged her to read them with her husband so he can her identify her being passive when she is displaying this tendency. The patient agreed that it would help her  relationship and marriage if she wasn't so passive, but more assertive. We discussed how she might be more active and busy at this time. I emphasized the importance of AA meetings and getting to know some women like herself who are also not drinking. She reported she is going to get involved with a group known as the "[employer]", which is an Medical sales representative of women who do lots of charity and work for [employer]. She noted she and Andrea Hanson are taking a real estate class at [facility] on Tuesday and Thursday evenings. She hopes to get into some sort of real estate business eventually. The patient reported she had a good weekend and they spent all of Saturday doing errands. Normally she would have Andrea Hanson go on those errands and she would stay home and drink, but this past Saturday, despite recognizing it would be a great day to drink, she had opted to stay with him and drive around all Saturday with the baby. The patient remains alcohol-free, but needs to reach out and begin building support for her recovery. She has no friends in town and knows few people. She has agreed to begin attending more meetings.

## 2011-10-25 NOTE — Progress Notes (Signed)
    Daily Group Progress Note  Program: CD-IOP   Group Time: 1-2:30 pm  Participation Level: Minimal  Behavioral Response: Appropriate  Type of Therapy: Psycho-education Group  Topic: Group Process: members continued to discuss the information gained from the film shown earlier in the session. Members talked about their experiences when they first began using alcohol and drugs and many of these descriptions matched the information provided in the film. I emphasized that they were not responsible for having an addiction, but that they are responsible for learning what they need to know to take care of their disease and what they must do to keep it in "remission".  Group Time: 2:45- 4pm  Participation Level: Minimal  Behavioral Response: Sharing  Type of Therapy: Process Group  Topic:Group Process: members continued to discuss the information gained from the film shown earlier in the session. Members talked about their experiences when they first began using alcohol and drugs and many of these descriptions matched the information provided in the film. I emphasized that they were not responsible for having an addiction, but that they are responsible for learning what they need to know to take care of their disease and what they must do to keep it in "remission".    Summary: the patient was attentive, but quiet in group as the film was shown. She confirmed that although she didn't begin drinking until she was 31 yo, she had a higher tolerance than most of her friends and attributed this to being the daughter of an alcoholic father. In process, she noted she had spent all of Saturday with her husband and their daughter. She admitted she had thought it would have been a great day to drink and would normally have stayed home while he ran errands. She would have drunk secretly while he was out of the house. She admitted she enjoyed their time together. The patient's sobriety date remains September  1rst.   Family Program: Family present? No   Name of family member(s):   UDS collected: No Results:   AA/NA attended?: No, the patient has been strongly encouraged to attend some AA meetings  Sponsor?: No   Sabriel Borromeo, LCAS

## 2011-10-26 ENCOUNTER — Other Ambulatory Visit (HOSPITAL_COMMUNITY): Payer: BC Managed Care – PPO

## 2011-10-29 ENCOUNTER — Other Ambulatory Visit (HOSPITAL_COMMUNITY): Payer: BC Managed Care – PPO | Admitting: Psychology

## 2011-10-30 LAB — PRESCRIPTION ABUSE MONITORING 17P, URINE
Benzodiazepine Screen, Urine: NEGATIVE ng/mL
Buprenorphine, Urine: NEGATIVE ng/mL
Cannabinoid Scrn, Ur: NEGATIVE ng/mL
Cocaine Metabolites: NEGATIVE ng/mL
Creatinine, Urine: 117.27 mg/dL (ref >20.0)
MDMA URINE: NEGATIVE ng/mL
Meperidine, Ur: NEGATIVE ng/mL
Methadone Screen, Urine: NEGATIVE ng/mL
Opiate Screen, Urine: NEGATIVE ng/mL
Tapentadol, urine: NEGATIVE ng/mL
Tramadol Scrn, Ur: NEGATIVE ng/mL

## 2011-10-30 LAB — ALCOHOL METABOLITE (ETG), URINE: Ethyl Glucuronide (EtG): NEGATIVE ng/mL

## 2011-10-30 NOTE — Progress Notes (Signed)
    Daily Group Progress Note  Program: CD-IOP   Group Time: 1-2:30 pm  Participation Level: Minimal  Behavioral Response: Sharing  Type of Therapy: Psycho-education Group  Topic: Chief Operating Officer and the Recovery Pie: first half of group spent checking-in. members shared about their recovery activities over the past weekend. While some members had attended 5 or 6 AA/NA meetings, some had not attended any meetings. It seemed perfect that the group had a guest speaker today. She had graduated successfully from the program and had attained over 8 months of sobriety. She shared about her own recovery program and what she has done to insure sobriety. The patient emphasized the importance of the 12-step community for her sobriety and reported she attends around 2 meetings per day. She calls her sponsor every day and frequently meets with others in recovery for lunch or coffee. The importance of her spiritual practice and faith are also very important on a daily basis. There was good feedback among members and a welcoming response to the guest speaker.   Group Time: 2:45- 4pm  Participation Level: Active  Behavioral Response: Sharing  Type of Therapy: Process Group  Topic:Group Process: the second half of group was spent in process. Members shared about their current difficulties and struggles in early recovery. One member admitted he was depressed and couldn't seem to get the drive to go to meetings. Another member stated she hated to ask her mother to take her to a meeting. One member admitted that she had been lying about attending meetings and working with her sponsor. She rationalized her use by suggesting she had got into bad habits protecting herself from her mother. None of the other group members seemed surprised by this disclosure. The importance of meeting attendance was reiterated and every member stated they would attend a meeting between today and the next group session.    Summary:  The patient reported the birthday party for her 29 yo daughter at [employer] had been exhausting and she was extremely tired at the end of the day. She explained she had returned with her mother to [place], [place] and when her mother had gone out for an errand, the patient was greeted by wine and some sort of wine coolers in the fridge. The patient admitted she had wanted to drink desperately and realized that no one would know if she had some. She reported that at that moment she had thought to herself, "I should attend a meeting". She noted she wasn't sure how she avoided drinking, but thought perhaps it was because she was pregnant. Another member pointed out that being pregnant hadn't stopped her for the past 5 months and wondered why it would stop her now. She had little insight into her thinking, but reported she remains sober with felt good about herself. She was instructed to get to some meetings and meet some young women who are staying sober.    Family Program: Family present? No   Name of family member(s):   UDS collected: Yes Results: negative  AA/NA attended?: No  Sponsor?: No   Danille Oppedisano, LCAS

## 2011-10-31 ENCOUNTER — Other Ambulatory Visit (HOSPITAL_COMMUNITY): Payer: BC Managed Care – PPO | Admitting: Psychology

## 2011-11-01 ENCOUNTER — Other Ambulatory Visit (HOSPITAL_COMMUNITY): Payer: Self-pay | Admitting: Physician Assistant

## 2011-11-01 ENCOUNTER — Encounter (HOSPITAL_COMMUNITY): Payer: Self-pay | Admitting: Psychology

## 2011-11-02 ENCOUNTER — Other Ambulatory Visit (HOSPITAL_COMMUNITY): Payer: BC Managed Care – PPO | Admitting: Psychology

## 2011-11-02 DIAGNOSIS — F1920 Other psychoactive substance dependence, uncomplicated: Secondary | ICD-10-CM
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
## 2011-11-05 ENCOUNTER — Other Ambulatory Visit (HOSPITAL_COMMUNITY): Payer: BC Managed Care – PPO

## 2011-11-05 ENCOUNTER — Encounter (HOSPITAL_COMMUNITY): Payer: Self-pay | Admitting: Psychology

## 2011-11-05 NOTE — Progress Notes (Signed)
    Daily Group Progress Note  Program: CD-IOP   Group Time: 1-2:30 pm  Participation Level: Minimal  Behavioral Response: Sharing  Type of Therapy: Psycho-education Group  Topic: Wheel of Life: first half of group spent in psycho-educational session. Members were provided with handouts to complete on their Wheel of Life. The wheel consisted of 8 segments reflecting 8 aspects of life that are critical in recovery. Members were asked to complete and draw on the board in order to share with their fellow group members. The session required disclosure and explanation and there was good feedback during the session.  Group Time: 2:45- 4pm  Participation Level: Minimal  Behavioral Response: Sharing  Type of Therapy: Process Group  Topic: Process/Wheel of Life: second half of group included the few members to complete their 'wheel". Upon completion, members shared about current issues and struggles they are experiencing. There was good sharing among the group. As the session ended, members shared their plans for the weekend. Every one of them stated their intention to attend at least one 12-step meeting.  Summary: The patient reported she had managed to remain sober since the last group despite definite thoughts of drinking. The patient's wheel was sorely lacking in categories relating to fun and recreation, personal and spiritual growth, as well as her marriage. The group was quick to encourage her to attend some AA meetings and meet other young women in recovery. The patient reported she is amazed that she has been sober for 27 days. The group applauded this news and the patient received good support and feedback from her fellow group members. There is a strong sense of concern among the group for this 31 yo pregnant woman and mother.   Family Program: Family present? No   Name of family member(s):   UDS collected: No Results:   AA/NA attended?: YesThursday  Sponsor?: No   Georgi Tuel,  LCAS

## 2011-11-06 NOTE — Progress Notes (Signed)
Patient ID: Andrea Hanson, female   DOB: 1980/08/15, 31 y.o.   MRN: [id number] Patient did not appear for group today. She had told me on Friday that she had a scheduled ultra sound on Monday and would probably be late for group, if she made it at all. During the session another member sent a text to [facility] and she responded that she was still in her appointment. Will excuse from group today.

## 2011-11-07 ENCOUNTER — Other Ambulatory Visit (HOSPITAL_COMMUNITY): Payer: BC Managed Care – PPO | Attending: Psychiatry | Admitting: Psychology

## 2011-11-07 DIAGNOSIS — F1020 Alcohol dependence, uncomplicated: Secondary | ICD-10-CM | POA: Insufficient documentation

## 2011-11-07 DIAGNOSIS — O99340 Other mental disorders complicating pregnancy, unspecified trimester: Secondary | ICD-10-CM | POA: Insufficient documentation

## 2011-11-08 LAB — PRESCRIPTION ABUSE MONITORING 17P, URINE
Benzodiazepine Screen, Urine: NEGATIVE ng/mL
Creatinine, Urine: 83.44 mg/dL (ref >20.0)
MDMA URINE: NEGATIVE ng/mL
Meperidine, Ur: NEGATIVE ng/mL
Methadone Screen, Urine: NEGATIVE ng/mL
Opiate Screen, Urine: NEGATIVE ng/mL
Propoxyphene: NEGATIVE ng/mL
Tapentadol, urine: NEGATIVE ng/mL
Zolpidem, Urine: NEGATIVE ng/mL

## 2011-11-09 ENCOUNTER — Other Ambulatory Visit (HOSPITAL_COMMUNITY): Payer: BC Managed Care – PPO

## 2011-11-09 ENCOUNTER — Ambulatory Visit (HOSPITAL_COMMUNITY)
Admission: RE | Admit: 2011-11-09 | Discharge: 2011-11-09 | Disposition: A | Discharge status: Home / Self Care | Payer: BC Managed Care – PPO | Source: Ambulatory Visit | Attending: Obstetrics and Gynecology | Admitting: Obstetrics and Gynecology

## 2011-11-09 ENCOUNTER — Other Ambulatory Visit: Payer: Self-pay | Admitting: Obstetrics and Gynecology

## 2011-11-09 ENCOUNTER — Encounter (HOSPITAL_COMMUNITY): Payer: Self-pay | Admitting: Psychology

## 2011-11-09 VITALS — BP 107/67 | HR 117 | Wt 139.0 lb

## 2011-11-09 DIAGNOSIS — O0930 Supervision of pregnancy with insufficient antenatal care, unspecified trimester: Secondary | ICD-10-CM

## 2011-11-09 DIAGNOSIS — O355XX0 Maternal care for (suspected) damage to fetus by drugs, not applicable or unspecified: Secondary | ICD-10-CM

## 2011-11-09 DIAGNOSIS — IMO0002 Reserved for concepts with insufficient information to code with codable children: Secondary | ICD-10-CM

## 2011-11-09 DIAGNOSIS — O9989 Other specified diseases and conditions complicating pregnancy, childbirth and the puerperium: Secondary | ICD-10-CM

## 2011-11-09 DIAGNOSIS — Z87898 Personal history of other specified conditions: Secondary | ICD-10-CM

## 2011-11-09 DIAGNOSIS — F1010 Alcohol abuse, uncomplicated: Secondary | ICD-10-CM

## 2011-11-09 DIAGNOSIS — F1510 Other stimulant abuse, uncomplicated: Secondary | ICD-10-CM

## 2011-11-09 DIAGNOSIS — D649 Anemia, unspecified: Secondary | ICD-10-CM

## 2011-11-09 NOTE — Progress Notes (Signed)
    Daily Group Progress Note  Program: CD-IOP   Group Time: 1-2:30 pm  Participation Level: Active  Behavioral Response: Appropriate  Type of Therapy: Group Therapy  Topic: Feelings: First part of group was spent addressing feelings. A 5 minute breathing exercise was provided, a form of Heart math, and then members were asked to share what they were feeling. While some members were able to identify feelings easily, others found them difficult to identify let alone articulate. I explained that this exercise would be repeated in future sessions in order to teach members to begin to identify their feelings, especially anxiety and tension, and learn how to calm or self-soothe in ways other than drinking or drugging.   Group Time: 2:45- 4pm  Participation Level: Active  Behavioral Response: Sharing  Type of Therapy: Process Group  Topic: Group Process: second half of group was spent in process. Members discussed their current issues and struggles in early recovery,. 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 Her right eye was swollen and red and she explained that she had fallen on the concrete. The group reviewed the events that had occurred prior to the actual relapse and members all agreed it was a "set-up". The member reported she had felt embarrassed, but now she knows she can't be around people using. Near the conclusion of the session, a member reported she would be departing, but shared her appreciation to the group for their support and encouragement   Summary: The patient reported she had picked up a 30 day chip in a meeting on Monday. She reported she had actually phoned a woman she had met in a meeting who had given her her phone number. The patient reported her husband had proven very aggravating to her yesterday and she had felt like drinking. She admitted she didn't tell her husband this and explained that she didn't feel like  he would understand. Plus she was mad at him and not feeling like sharing. She also said that if she told him she was craving wine, he might get angry or suspicious. She questioned whether she was still attractive to him and noted that she was "fat" - she is 5 months pregnant. The group provided good feedback and could commiserate with her frustrations. Another member reminded her that being able to talk to people who understand and have been there really helps. The patient was open with the group and appears to be speaking more freely about her feelings. 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    Family Program: Family present? No   Name of family member(s):   UDS collected: Yes Results: negative  AA/NA attended?: YesMonday  Sponsor?: No   Shaunta Oncale, LCAS

## 2011-11-09 NOTE — Progress Notes (Signed)
Andrea Hanson  was seen today for an ultrasound appointment.  See full report in [facility].  Alpha Gula, MD  IUP at 27+1 weeks Normal interval anatomy Normal amniotic fluid volume  Interval growth appropriate (37th %tile)  Fetal ECHO re-scheduled  Follow-up ultrasound for growth in 4 weeks

## 2011-11-12 ENCOUNTER — Other Ambulatory Visit (HOSPITAL_COMMUNITY): Payer: BC Managed Care – PPO

## 2011-11-12 ENCOUNTER — Other Ambulatory Visit: Payer: Self-pay | Admitting: Obstetrics and Gynecology

## 2011-11-12 ENCOUNTER — Encounter (HOSPITAL_COMMUNITY): Payer: Self-pay | Admitting: Psychology

## 2011-11-12 ENCOUNTER — Ambulatory Visit (HOSPITAL_COMMUNITY)
Admission: RE | Admit: 2011-11-12 | Discharge: 2011-11-12 | Disposition: A | Discharge status: Home / Self Care | Payer: BC Managed Care – PPO | Source: Ambulatory Visit | Attending: Obstetrics and Gynecology | Admitting: Obstetrics and Gynecology

## 2011-11-12 ENCOUNTER — Encounter: Payer: Self-pay | Admitting: Family Medicine

## 2011-11-12 DIAGNOSIS — O355XX0 Maternal care for (suspected) damage to fetus by drugs, not applicable or unspecified: Secondary | ICD-10-CM

## 2011-11-12 DIAGNOSIS — O0930 Supervision of pregnancy with insufficient antenatal care, unspecified trimester: Secondary | ICD-10-CM

## 2011-11-12 DIAGNOSIS — O9989 Other specified diseases and conditions complicating pregnancy, childbirth and the puerperium: Secondary | ICD-10-CM

## 2011-11-12 LAB — ETHYL GLUCURONIDE, URINE: Ethyl Glucuronide (EtG): 19679 NG/ML — ABNORMAL HIGH

## 2011-11-12 NOTE — Progress Notes (Signed)
Patient ID: Andrea Hanson, female   DOB: Aug 30, 1980, 31 y.o.   MRN: [id number] I received a voice mail message from this patient early this morning. She phoned to report that she would not be able to meet with me at 11 am today for her individual therapy session and might not be in group. She explained her OBGYN had phoned and instructed her and her husband to meet with him at noon today to discuss the results of the Ultra Sound testing completed this past Monday. She stated she would contact me at the completion of her session. Will wait to hear from this patient, who is 5+ months pregnant.

## 2011-11-13 NOTE — Progress Notes (Signed)
Patient ID: Andrea Hanson, female   DOB: [birth year], 31 y.o.   MRN: [id number] CD-IOP: Treatment Plan Update. Met wit the patient this morning. She had missed our appointment last week after being called in unexpectedly to discuss the results of her Ultra Sound. Today she explained that the fetus development was fine, but that she needed to eat less salt, more healthy food and eliminate some of the stress in her life. Today she admitted she is feeling overwhelmed about how she will possibly care for a newborn along with her 50 mo daughter, Andrea Hanson? We agreed to discuss this further in our next session and as she is only in her 27th week. I explained the importance of reviewing her treatment goals and we discussed her first goal, which is sobriety. The patient reported she has done well, but admitted that she had drank something that she had previously unreported. She disclosed this in light of a recent UA that proved positive for alcohol. The patient admitted she had been very frustrated with her husband and drank part of a beer in the refrigerator. She then immediately went into the bathroom, per her report, and threw it up. I wondered why there was alcohol in the home? It's very difficult to stop drinking anyway, but even more so when one has to look at it every time they open the refrigerator door. She stated that it was her husband's alcohol. I informed her I would speak to [place] about keeping the apartment alcohol-free. Regarding her 2nd treatment goal, the patient has attended very few AA meetings. I emphasized the importance of building support among other women like herself. She agreed she needed to attend more meetings and has found the 8 am AA meeting at [address] to her liking. Her 3rd and final goal relates to her pregnancy and delivering a healthy baby. The patient pointed out that she has realized she is drinking her V-8 drinks as if they are alcohol and has at least 4-5 per day. I noted the high  sodium content and how those might be contributing to her ob-gyn ordering her to reduce her sodium intake. The patient reported she also puts salt on everything. She reported she has been instructed to increase her caloric intake and admitted she feels 'fat'. we discussed the benefits of frozen vegetables and the problems with canned goods. The patient has reduced her alcohol intake considerably, but has used in the past week. She has not built the support through AA and has very little support in the community. She has not followed through on visiting churches on Sunday as she has previously mentioned. The treatment plan update was reviewed and documentation completed accordingly. We will continue to follow closely in the days and weeks ahead.

## 2011-11-14 ENCOUNTER — Other Ambulatory Visit (HOSPITAL_COMMUNITY): Payer: BC Managed Care – PPO

## 2011-11-14 ENCOUNTER — Encounter (HOSPITAL_COMMUNITY): Payer: Self-pay | Admitting: Psychology

## 2011-11-14 NOTE — Progress Notes (Signed)
Patient ID: Andrea Hanson, female   DOB: 1980-08-19, 31 y.o.   MRN: [id number] The patient did not appear for group today nor did she return phone calls inquiring about her well-being. She had missed Monday's group session because she was supposed to have a follow-up ultra-sound, but today she did not phone to explain her absence. She is 5+ months pregnant. I phoned her husband, Andrea Hanson, at work and he reported she was at home and fine, but he did not know why she had not attended group. He reported he would ask her about this when he got home. Will wait to hear from her.

## 2011-11-15 ENCOUNTER — Encounter (HOSPITAL_COMMUNITY): Payer: Self-pay | Admitting: Psychology

## 2011-11-15 NOTE — Progress Notes (Signed)
Patient ID: Andrea Hanson, female   DOB: [birth year], 31 y.o.   MRN: [id number] The patient phoned and left a voice mail message early this morning. She apologized for not having come to group. She admitted that she had lied and had drank a lot more than she reported when she described her slip. She sounded discouraged and reported she just didn't want to have to tell the group she had drank again. I returned her call and encouraged her to return to group tomorrow. I reminded the patient that by not returning she allows her addiction to control her and I feel certain that is not want she wants. I told her that the group really cares about her and I asked that she return tomorrow for the group session. We will wait to see if she shows up.

## 2011-11-16 ENCOUNTER — Other Ambulatory Visit (HOSPITAL_COMMUNITY): Payer: BC Managed Care – PPO

## 2011-11-19 ENCOUNTER — Other Ambulatory Visit (HOSPITAL_COMMUNITY): Payer: BC Managed Care – PPO

## 2011-11-19 ENCOUNTER — Encounter: Payer: Self-pay | Admitting: Family Medicine

## 2011-11-19 NOTE — Progress Notes (Signed)
Dear  Colleagues,  I had the pleasure of seeing Andrea Hanson for initial consultation in the Fetal Heart Program at [facility]. As you know, she is a 31 y/o G2P0 currently at 27 weeks 4 days gestation referred for fetal cardiovascular evaluation secondary to fetal exposure to alcohol.  This pregnancy was conceived naturally. There has not been an amniocentesis to date. She has not experienced any significant intercurrent illnesses or hospitalizations. She is on prenatal vitamins. She denies any exposure to tobacco, alcohol or recreational drugs. She denies any complaints today.  All systems reviewed and negative other than listed in HPI.  There is no family history of congenital heart disease, birth defects, genetic abnormalities, arrhythmia, pacemakers or sudden cardiac death.    I personally performed and reviewed a complete Fetal echocardiogram with Doppler assessment today. Please refer to a copy of the final report.  In summary, there was normal fetal cardiovascular anatomy and function.  We did not identify any intracardiac abnormalities. There was normal Doppler patterns in the umbilical vessels, ductus venosus and middle cerebral artery. There was a normal fetal heart rate and rhythm, without ectopy, noted throughout today's study.  In summary, the cardiovascular anatomy, function, Doppler flow patterns, and heart rhythm are normal. I have communicated this information to the patient and have counseled her accordingly. I have discussed that fetal echocardiography may not be able to detect minor abnormalities such as small ventricular septal defects, subtle valve abnormalities or mild coarctation of the aorta. Nor can it predict postnatal persistence of normal fetal structures such as a patent foramen ovale or patent ductus arteriosus.  I do not feel that a prenatal follow-up appointment is necessary for your patient in the Fetal Heart Program at [facility] but I am glad to see her back if you have any persistent concerns or if new clinical problems arise. I also do not feel that a post-natal echocardiogram is necessary after delivery unless indicated by clinical conditions. Please do not hesitate to contact me with any questions or concerns.  Thank you for involving me in the care of your patient today.   Sincerely,  Massie Bougie, MD Pediatric Cardiology [facility]  Total Time: 30 minues. Time of Counseling/Coordinating care: 20 minutes.

## 2011-11-21 ENCOUNTER — Other Ambulatory Visit (HOSPITAL_COMMUNITY): Payer: BC Managed Care – PPO

## 2011-11-22 ENCOUNTER — Encounter (HOSPITAL_COMMUNITY): Payer: Self-pay | Admitting: Psychology

## 2011-11-22 NOTE — Progress Notes (Signed)
Patient ID: Andrea Hanson, female   DOB: 1980/07/19, 31 y.o.   MRN: [id number] CD-IOP: Individual Therapy Session. I met with the patient today at noon, before her group session. She reported she had attended a meeting on Monday night, but spent yesterday at home helping around the house. She reported she had written the letter or paper I had requested that she complete. I had asked her to write about her life. She noted she really enjoyed writing and had written 5 pages. When asked how she was feeling, she admitted she was 'really dragging' and explained that her blood sugar was very high (344) this morning. She reported she had taken 4 Lasix yesterday to try and work off the fluid buildup in her abdomen. She questioned whether her kidneys might need some help? I wondered if she had phoned her endocrinologist as she had agreed to do. The patient has not seen him since January of this year and her blood sugar levels have been very high and worrisome. She admitted she had not phoned and complained about her physician. She reported his Nurse Practitioner is much more pleasant and informative and I suggested she contact the office and schedule with him. Per our medical director's report, a diabetic should probably see her physician at least quarterly. In reviewing her goals, the patient reported she remains sober with a sobriety date of 9/9. She is attending more meetings and it feels good. She reported she intended to phone a woman she knows and ask her to be her sponsor. Regarding her 3rd goal of treatment - to become more emotionally stable and less depressed, the patient reported she is feeling like an "8" and that she isn't feeling depressed. As for her 4th treatment goal regarding her diabetes, the patient has been struggling with inconsistent and high blood sugar levels and has agreed to contact her MD and meet with him. 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 The patient is making good progress in her recovery and displays a good understanding of her daily recovery needs.

## 2011-11-23 ENCOUNTER — Other Ambulatory Visit (HOSPITAL_COMMUNITY): Payer: BC Managed Care – PPO | Admitting: Psychology

## 2011-11-26 ENCOUNTER — Other Ambulatory Visit (HOSPITAL_COMMUNITY): Payer: BC Managed Care – PPO

## 2011-11-26 NOTE — Progress Notes (Signed)
    Daily Group Progress Note  Program: CD-IOP   Group Time: 1-2:30 pm  Participation Level: Active  Behavioral Response: Appropriate and Sharing  Type of Therapy: Process Group  Topic: Group Process: first half of group was spent in process. Members shared about their current struggles and issues in early recovery. During check-in, no one had relapsed since the previous group session. There was good discussion among the group, especially after one member admitted he had been thinking about drinking tomorrow when his parents are out of town and he is alone at the house. There was good feedback among group members. Also present was a woman who is in the [facility] nursing program and a visitor this afternoon. She asked good questions and made some excellent observations during the session.  Group Time: 2:45- 4pm  Participation Level: Active  Behavioral Response: Sharing  Type of Therapy: Psycho-education Group  Topic: Psycho-Ed: second half of group included handouts and an educational piece on how group works and the responsibilities of each member. The group discussed how members avoid being fully themselves and examples of each were identified by members. The importance of the rules and expectations of the group and each member were carefully identified and discussed. The session proved extremely effective in educating members on the purpose of group therapy and how each of them should come to the session.  Summary: the patient returned after a number of absences. She reported she is working on improving her diet, but admitted she has a real craving for salt. She expressed frustration with her husband and their difficulties coming to the same conclusions about their plans. He has received a promotion which entails moving to [place] and with her being 7 months pregnant, there are some issues that must be addressed. She admitted she doesn't understand why he gets this "attitude", but noted  she feels it would be much easier for her to move to [place], [place] and live with her mother until she has the baby. The patient used the opportunity to vent and received good feedback from the group. She felt "heard". She has agreed to re-engage in treatment and will not miss any further group sessions.    Family Program: Family present? No   Name of family member(s):   UDS collected: No Results: patient was given a bottle, but it was returned empty. Will ask about this on Monday in group.  AA/NA attended?: No  Sponsor?: No   Montie Swiderski, LCAS

## 2011-11-28 ENCOUNTER — Other Ambulatory Visit (HOSPITAL_COMMUNITY): Payer: BC Managed Care – PPO

## 2011-11-28 ENCOUNTER — Ambulatory Visit (HOSPITAL_COMMUNITY): Payer: Self-pay | Admitting: Marriage and Family Therapist

## 2011-11-30 ENCOUNTER — Other Ambulatory Visit (HOSPITAL_COMMUNITY): Payer: BC Managed Care – PPO

## 2011-12-03 ENCOUNTER — Other Ambulatory Visit (HOSPITAL_COMMUNITY): Payer: BC Managed Care – PPO

## 2011-12-03 NOTE — Progress Notes (Signed)
Patient ID: Andrea Hanson, female   DOB: 1980/07/01, 31 y.o.   MRN: [id number] Discharge from CD-IOP. The patient contacted me and reported she is moving to [place], [place] to stay with her mother until she gives birth to her second child sometime in late December. Despite encouraging her to return to group and complete closure with her fellow group members through a healthy farewell, she did not return to group. Please see discharge summary and discharge plan in epic, dated 11/30/2011.

## 2011-12-05 ENCOUNTER — Other Ambulatory Visit: Payer: Self-pay | Admitting: Obstetrics and Gynecology

## 2011-12-05 ENCOUNTER — Other Ambulatory Visit (HOSPITAL_COMMUNITY): Payer: BC Managed Care – PPO

## 2011-12-05 DIAGNOSIS — O09899 Supervision of other high risk pregnancies, unspecified trimester: Secondary | ICD-10-CM

## 2011-12-06 ENCOUNTER — Ambulatory Visit (INDEPENDENT_AMBULATORY_CARE_PROVIDER_SITE_OTHER): Payer: BC Managed Care – PPO | Admitting: Family Medicine

## 2011-12-06 ENCOUNTER — Encounter: Payer: Self-pay | Admitting: Family Medicine

## 2011-12-06 VITALS — Ht 64.0 in | Wt 138.6 lb

## 2011-12-06 DIAGNOSIS — F3289 Other specified depressive episodes: Secondary | ICD-10-CM

## 2011-12-06 DIAGNOSIS — F329 Major depressive disorder, single episode, unspecified: Secondary | ICD-10-CM

## 2011-12-06 DIAGNOSIS — F1010 Alcohol abuse, uncomplicated: Secondary | ICD-10-CM

## 2011-12-06 DIAGNOSIS — O0990 Supervision of high risk pregnancy, unspecified, unspecified trimester: Secondary | ICD-10-CM

## 2011-12-06 DIAGNOSIS — O99340 Other mental disorders complicating pregnancy, unspecified trimester: Secondary | ICD-10-CM

## 2011-12-06 DIAGNOSIS — Z23 Encounter for immunization: Secondary | ICD-10-CM

## 2011-12-06 LAB — POCT URINALYSIS DIP (DEVICE)
Leukocytes, UA: NEGATIVE
Nitrite: NEGATIVE
Protein, ur: 30 mg/dL — AB
pH: 6.5 (ref 5.0–8.0)

## 2011-12-06 MED ORDER — INFLUENZA VIRUS VACC SPLIT PF IM SUSP
0.5000 mL | Freq: Once | INTRAMUSCULAR | Status: AC
  Administered 2011-12-06: 0.5 mL via INTRAMUSCULAR

## 2011-12-06 MED ORDER — RANITIDINE HCL 150 MG PO TABS: 150.0000 mg | ORAL_TABLET | Freq: Every evening | ORAL | Status: AC | PRN

## 2011-12-06 MED ORDER — BUPROPION HCL ER (XL) 150 MG PO TB24: 150.0000 mg | ORAL_TABLET | Freq: Every day | ORAL | Status: AC

## 2011-12-06 NOTE — Progress Notes (Signed)
Nutrition Note: 1st visit consult Pt has gained 20.6# @ [redacted]w[redacted]d, which is wnl. Pt reports eating 3 meals & 2-3 snacks/d and drinks water & gatorade daily but no milk. Pt reports N&V q other day. Pt reports frequent heartburn. Pt is taking PNV. Pt is allergic to shellfish. Pt received verbal & written education on general diet during pregnancy. Encouraged calcium rich foods. Provided handout on decreasing N&V. Pt does not receive WIC but plans to apply when she moves. Pt does plan to BF. F/u as needed Blondell Reveal, MS, RD, LDN

## 2011-12-06 NOTE — Patient Instructions (Signed)
Pregnancy - Third Trimester  The third trimester of pregnancy (the last 3 months) is a period of the most rapid growth for you and your baby. The baby approaches a length of 20 inches and a weight of 6 to 10 pounds. The baby is adding on fat and getting ready for life outside your body. While inside, babies have periods of sleeping and waking, suck their thumbs, and hiccups. You can often feel small contractions of the uterus. This is false labor. It is also called Braxton-Hicks contractions. This is like a practice for labor. The usual problems in this stage of pregnancy include more difficulty breathing, swelling of the hands and feet from water retention, and having to urinate more often because of the uterus and baby pressing on your bladder.   PRENATAL EXAMS  · Blood work may continue to be done during prenatal exams. These tests are done to check on your health and the probable health of your baby. Blood work is used to follow your blood levels (hemoglobin). Anemia (low hemoglobin) is common during pregnancy. Iron and vitamins are given to help prevent this. You may also continue to be checked for diabetes. Some of the past blood tests may be done again.  · The size of the uterus is measured during each visit. This makes sure your baby is growing properly according to your pregnancy dates.  · Your blood pressure is checked every prenatal visit. This is to make sure you are not getting toxemia.  · Your urine is checked every prenatal visit for infection, diabetes and protein.  · Your weight is checked at each visit. This is done to make sure gains are happening at the suggested rate and that you and your baby are growing normally.  · Sometimes, an ultrasound is performed to confirm the position and the proper growth and development of the baby. This is a test done that bounces harmless sound waves off the baby so your caregiver can more accurately determine due dates.  · Discuss the type of pain medication and  anesthesia you will have during your labor and delivery.  · Discuss the possibility and anesthesia if a Cesarean Section might be necessary.  · Inform your caregiver if there is any mental or physical violence at home.  Sometimes, a specialized non-stress test, contraction stress test and biophysical profile are done to make sure the baby is not having a problem. Checking the amniotic fluid surrounding the baby is called an amniocentesis. The amniotic fluid is removed by sticking a needle into the belly (abdomen). This is sometimes done near the end of pregnancy if an early delivery is required. In this case, it is done to help make sure the baby's lungs are mature enough for the baby to live outside of the womb. If the lungs are not mature and it is unsafe to deliver the baby, an injection of cortisone medication is given to the mother 1 to 2 days before the delivery. This helps the baby's lungs mature and makes it safer to deliver the baby.  CHANGES OCCURING IN THE THIRD TRIMESTER OF PREGNANCY  Your body goes through many changes during pregnancy. They vary from person to person. Talk to your caregiver about changes you notice and are concerned about.  · During the last trimester, you have probably had an increase in your appetite. It is normal to have cravings for certain foods. This varies from person to person and pregnancy to pregnancy.  · You may begin to   get stretch marks on your hips, abdomen, and breasts. These are normal changes in the body during pregnancy. There are no exercises or medications to take which prevent this change.  · Constipation may be treated with a stool softener or adding bulk to your diet. Drinking lots of fluids, fiber in vegetables, fruits, and whole grains are helpful.  · Exercising is also helpful. If you have been very active up until your pregnancy, most of these activities can be continued during your pregnancy. If you have been less active, it is helpful to start an exercise  program such as walking. Consult your caregiver before starting exercise programs.  · Avoid all smoking, alcohol, un-prescribed drugs, herbs and "street drugs" during your pregnancy. These chemicals affect the formation and growth of the baby. Avoid chemicals throughout the pregnancy to ensure the delivery of a healthy infant.  · Backache, varicose veins and hemorrhoids may develop or get worse.  · You will tire more easily in the third trimester, which is normal.  · The baby's movements may be stronger and more often.  · You may become short of breath easily.  · Your belly button may stick out.  · A yellow discharge may leak from your breasts called colostrum.  · You may have a bloody mucus discharge. This usually occurs a few days to a week before labor begins.  HOME CARE INSTRUCTIONS   · Keep your caregiver's appointments. Follow your caregiver's instructions regarding medication use, exercise, and diet.  · During pregnancy, you are providing food for you and your baby. Continue to eat regular, well-balanced meals. Choose foods such as meat, fish, milk and other low fat dairy products, vegetables, fruits, and whole-grain breads and cereals. Your caregiver will tell you of the ideal weight gain.  · A physical sexual relationship may be continued throughout pregnancy if there are no other problems such as early (premature) leaking of amniotic fluid from the membranes, vaginal bleeding, or belly (abdominal) pain.  · Exercise regularly if there are no restrictions. Check with your caregiver if you are unsure of the safety of your exercises. Greater weight gain will occur in the last 2 trimesters of pregnancy. Exercising helps:  · Control your weight.  · Get you in shape for labor and delivery.  · You lose weight after you deliver.  · Rest a lot with legs elevated, or as needed for leg cramps or low back pain.  · Wear a good support or jogging bra for breast tenderness during pregnancy. This may help if worn during  sleep. Pads or tissues may be used in the bra if you are leaking colostrum.  · Do not use hot tubs, steam rooms, or saunas.  · Wear your seat belt when driving. This protects you and your baby if you are in an accident.  · Avoid raw meat, cat litter boxes and soil used by cats. These carry germs that can cause birth defects in the baby.  · It is easier to loose urine during pregnancy. Tightening up and strengthening the pelvic muscles will help with this problem. You can practice stopping your urination while you are going to the bathroom. These are the same muscles you need to strengthen. It is also the muscles you would use if you were trying to stop from passing gas. You can practice tightening these muscles up 10 times a set and repeating this about 3 times per day. Once you know what muscles to tighten up, do not perform these   exercises during urination. It is more likely to cause an infection by backing up the urine.  · Ask for help if you have financial, counseling or nutritional needs during pregnancy. Your caregiver will be able to offer counseling for these needs as well as refer you for other special needs.  · Make a list of emergency phone numbers and have them available.  · Plan on getting help from family or friends when you go home from the hospital.  · Make a trial run to the hospital.  · Take prenatal classes with the father to understand, practice and ask questions about the labor and delivery.  · Prepare the baby's room/nursery.  · Do not travel out of the city unless it is absolutely necessary and with the advice of your caregiver.  · Wear only low or no heal shoes to have better balance and prevent falling.  MEDICATIONS AND DRUG USE IN PREGNANCY  · Take prenatal vitamins as directed. The vitamin should contain 1 milligram of folic acid. Keep all vitamins out of reach of children. Only a couple vitamins or tablets containing iron may be fatal to a baby or young child when ingested.  · Avoid use  of all medications, including herbs, over-the-counter medications, not prescribed or suggested by your caregiver. Only take over-the-counter or prescription medicines for pain, discomfort, or fever as directed by your caregiver. Do not use aspirin, ibuprofen (Motrin®, Advil®, Nuprin®) or naproxen (Aleve®) unless OK'd by your caregiver.  · Let your caregiver also know about herbs you may be using.  · Alcohol is related to a number of birth defects. This includes fetal alcohol syndrome. All alcohol, in any form, should be avoided completely. Smoking will cause low birth rate and premature babies.  · Street/illegal drugs are very harmful to the baby. They are absolutely forbidden. A baby born to an addicted mother will be addicted at birth. The baby will go through the same withdrawal an adult does.  SEEK MEDICAL CARE IF:  You have any concerns or worries during your pregnancy. It is better to call with your questions if you feel they cannot wait, rather than worry about them.  DECISIONS ABOUT CIRCUMCISION  You may or may not know the sex of your baby. If you know your baby is a boy, it may be time to think about circumcision. Circumcision is the removal of the foreskin of the penis. This is the skin that covers the sensitive end of the penis. There is no proven medical need for this. Often this decision is made on what is popular at the time or based upon religious beliefs and social issues. You can discuss these issues with your caregiver or pediatrician.  SEEK IMMEDIATE MEDICAL CARE IF:   · An unexplained oral temperature above 102° F (38.9° C) develops, or as your caregiver suggests.  · You have leaking of fluid from the vagina (birth canal). If leaking membranes are suspected, take your temperature and tell your caregiver of this when you call.  · There is vaginal spotting, bleeding or passing clots. Tell your caregiver of the amount and how many pads are used.  · You develop a bad smelling vaginal discharge with  a change in the color from clear to white.  · You develop vomiting that lasts more than 24 hours.  · You develop chills or fever.  · You develop shortness of breath.  · You develop burning on urination.  · You loose more than 2 pounds of weight   or gain more than 2 pounds of weight or as suggested by your caregiver.  · You notice sudden swelling of your face, hands, and feet or legs.  · You develop belly (abdominal) pain. Round ligament discomfort is a common non-cancerous (benign) cause of abdominal pain in pregnancy. Your caregiver still must evaluate you.  · You develop a severe headache that does not go away.  · You develop visual problems, blurred or double vision.  · If you have not felt your baby move for more than 1 hour. If you think the baby is not moving as much as usual, eat something with sugar in it and lie down on your left side for an hour. The baby should move at least 4 to 5 times per hour. Call right away if your baby moves less than that.  · You fall, are in a car accident or any kind of trauma.  · There is mental or physical violence at home.  Document Released: 01/16/2001 Document Revised: 04/16/2011 Document Reviewed: 07/21/2008  [employer]® Patient Information ©2013 [employer].

## 2011-12-06 NOTE — Progress Notes (Signed)
Subjective:    Andrea Hanson is being seen today for her first obstetrical visit.  This is not a planned pregnancy. She is at [redacted]w[redacted]d gestation. Her obstetrical history is significant for alcohol use early pregnancy. She was referred from [facility] for this reason. Relationship with FOB: spouse, living together. Patient does intend to breast feed. Pregnancy history fully reviewed. She drank 1-3 drinks a day in early pregnancy. Was admitted for detox 8/31 - 9/4. Has been free of alcohol since that time and participating in recovery group and AA. Genetic counseling provided. Fetal anatomy scan normal (10/2). Fetal echo normal. Has f/u ultrasound for growth tomorrow. Pt also has used marijuana and amphetamines but denies recent use.  Pt is moving Sunday to [place] for 10 weeks due to husband's job then to [place].  Pt's medical, surgical, family and social history reviewed, as well as medications, allergies, labs, imaging and prior prenatal notes.  Menstrual History: OB History    Grav Para Term Preterm Abortions TAB SAB Ect Mult Living   2 1 1  0 0 0 0 0 0 1     Obstetric Comments   PP DEPRESSION; +GBS      Menarche age: 73 Patient's last menstrual period was 05/03/2011.    The following portions of the patient's history were reviewed and updated as appropriate: allergies, current medications, past family history, past medical history, past social history, past surgical history and problem list. Mild cramping off and on. Some nausea/vomiting. AM nausea. Heartburn.  Review of Systems Constitutional: negative for chills and fevers Eyes: negative for visual disturbance Respiratory: negative for cough and wheezing Cardiovascular: negative except for chest pain, dyspnea and palpitations Gastrointestinal: negative for abdominal pain, constipation, diarrhea, nausea/vomiting primarily in AM, especially if ate fatty meal the night before. Genitourinary:negative for dysuria and vaginal  discharge Neurological: negative for dizziness, headaches and tremors Behavioral/Psych: mood currently stable, no suicidal ideation. On bupropion. Aware of risks/benefits in pregnancy.     Objective:    Ht 5\' 4"  (1.626 m)  Wt 138 lb 9.6 oz (62.869 kg)  BMI 23.79 kg/m2  LMP 05/03/2011  Breastfeeding? Unknown  General Appearance:    Alert, cooperative, no distress, appears stated age  Head:    Normocephalic, without obvious abnormality, atraumatic  Eyes:    PERRL, conjunctiva/corneas clear, EOM's intact  Neck:   Supple, symmetrical, trachea midline, no adenopathy;    thyroid:  no enlargement/tenderness/nodules;  Lungs:     Clear to auscultation bilaterally, respirations unlabored   Heart:    Regular rate and rhythm, S1 and S2 normal, no murmur, rub   or gallop  Abdomen:     Soft, non-tender, normal bowel sounds  Genitalia:    Normal female without lesion, discharge or tenderness  Rectal:    Normal tone, normal prostate, no masses or tenderness;   guaiac negative stool  Extremities:   Extremities normal, atraumatic, no cyanosis or edema  Skin:   Skin color, texture, turgor normal, no rashes or lesions  Neurologic:   No focal deficits, alert and oriented      Assessment:    Pregnancy at 31 and 0/7 weeks    Plan:    Initial labs drawn. Prenatal vitamins. Ranitidine for reflux/AM vomiting. Problem list reviewed and updated. 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 UDS today. AFP3 discussed: results reviewed normal. Role of ultrasound in pregnancy discussed; fetal survey: results reviewed normal. Amniocentesis discussed: not indicated. Follow up in 2 weeks if still in [place], otherwise establish care in [place].  50% of 45 min visit spent on counseling and coordination of care.

## 2011-12-06 NOTE — Progress Notes (Signed)
P -  Occasional cramping pain

## 2011-12-07 ENCOUNTER — Other Ambulatory Visit (HOSPITAL_COMMUNITY): Payer: BC Managed Care – PPO | Attending: Psychiatry

## 2011-12-07 ENCOUNTER — Ambulatory Visit (HOSPITAL_COMMUNITY): Payer: BC Managed Care – PPO | Attending: Family Medicine

## 2011-12-07 LAB — DRUG SCREEN, URINE
Amphetamine Screen, Ur: NEGATIVE
Benzodiazepines.: NEGATIVE
Cocaine Metabolites: NEGATIVE
Phencyclidine (PCP): NEGATIVE

## 2011-12-07 LAB — WET PREP, GENITAL

## 2011-12-10 ENCOUNTER — Other Ambulatory Visit (HOSPITAL_COMMUNITY): Payer: BC Managed Care – PPO

## 2011-12-12 ENCOUNTER — Other Ambulatory Visit (HOSPITAL_COMMUNITY): Payer: BC Managed Care – PPO

## 2011-12-14 ENCOUNTER — Other Ambulatory Visit (HOSPITAL_COMMUNITY): Payer: BC Managed Care – PPO

## 2011-12-17 ENCOUNTER — Other Ambulatory Visit (HOSPITAL_COMMUNITY): Payer: BC Managed Care – PPO

## 2013-05-27 IMAGING — US US OB DETAIL+14 WK
1 series · 12 of 28 positions shown · non-contrast
Comparison: none

[Series 1: us ob detail+14 wk · 0.19mm/px · 12 of 91 slices shown]
[im 4/91]
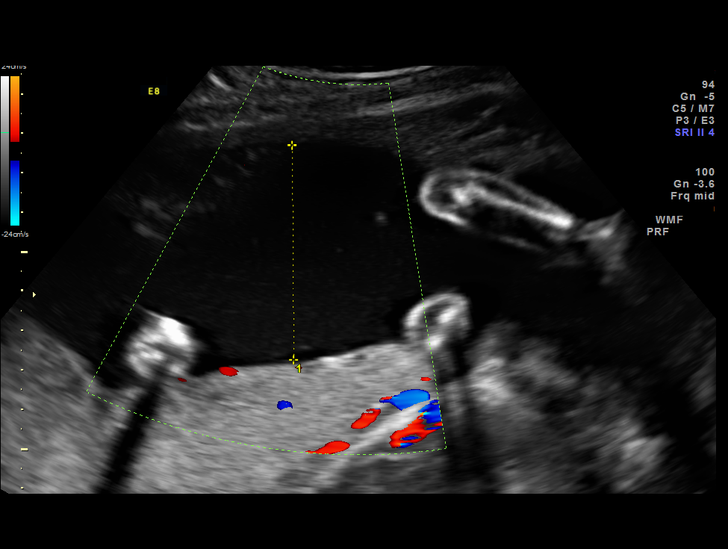
[im 11/91]
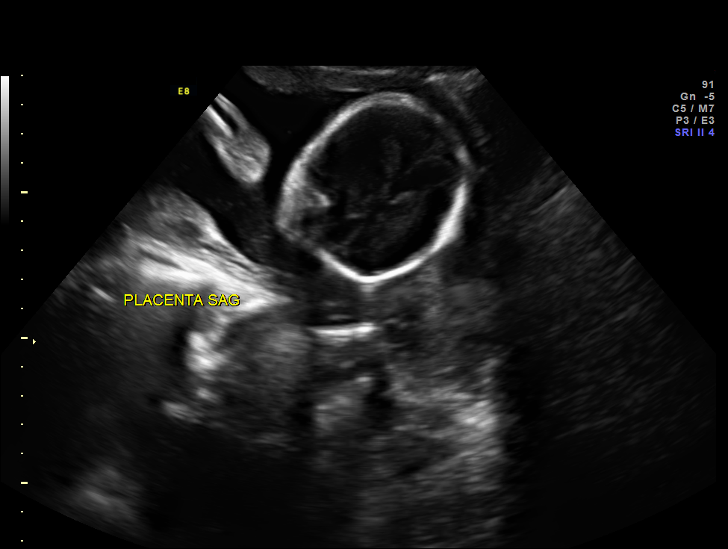
[im 17/91]
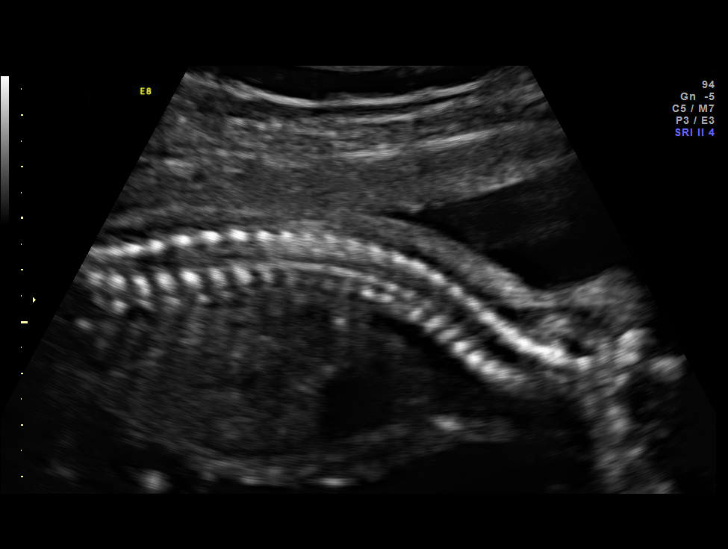
[im 27/91]
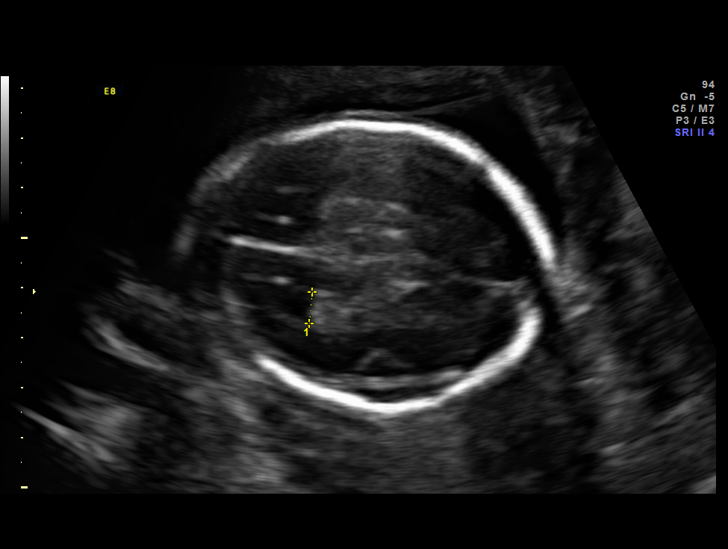
[im 34/91]
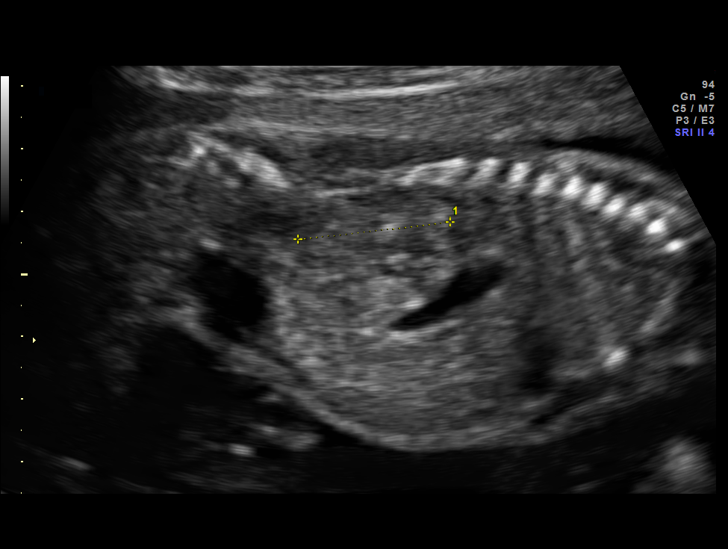
[im 41/91]
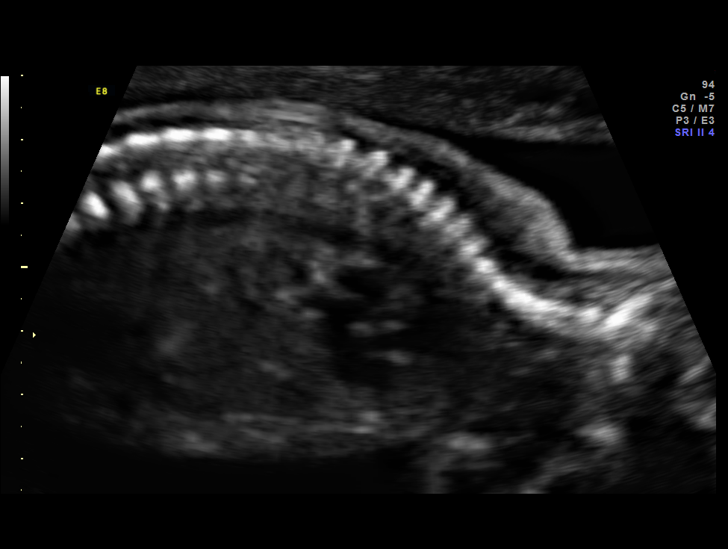
[im 51/91]
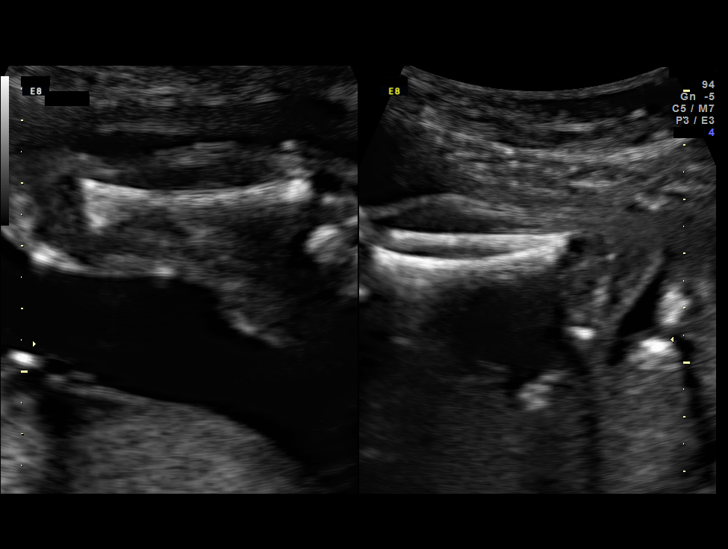
[im 57/91]
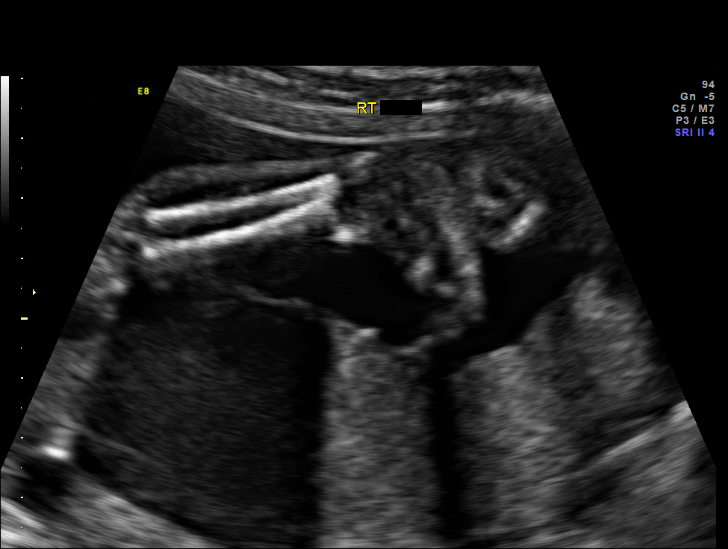
[im 64/91]
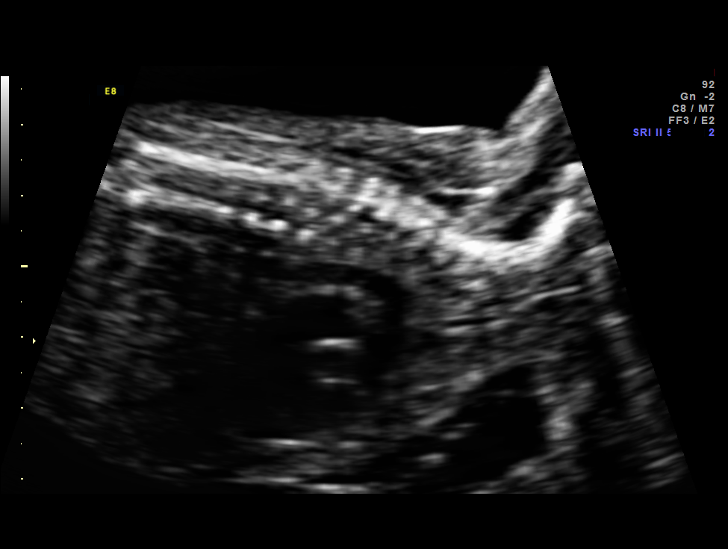
[im 74/91]
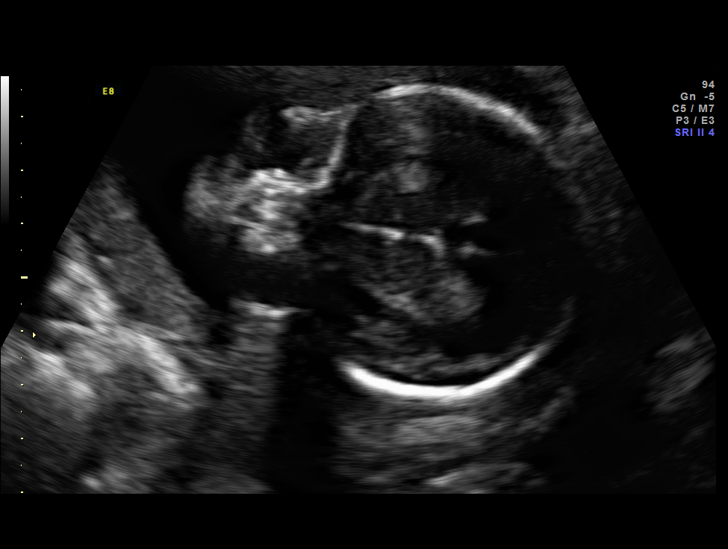
[im 81/91]
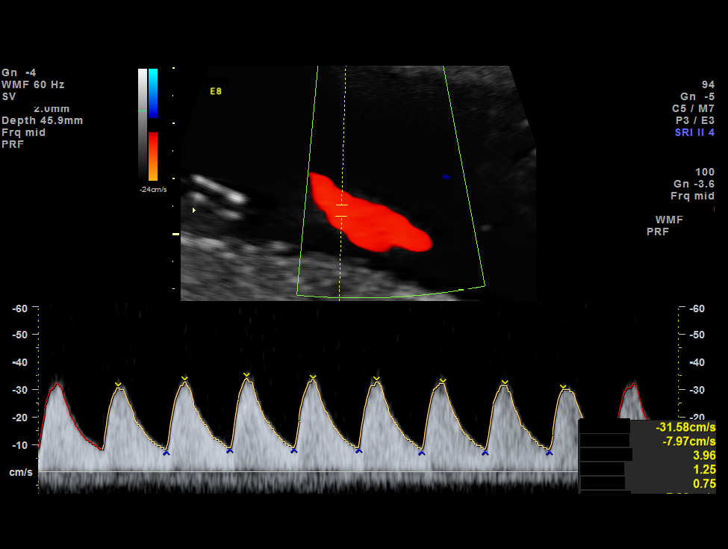
[im 87/91]
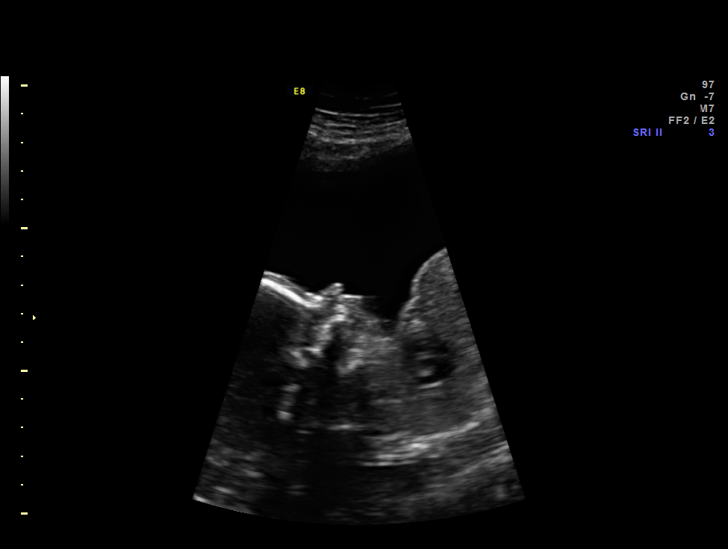

[12 of 28 positions shown; findings below may reference images not displayed]

OBSTETRICS REPORT
                      (Signed Final 10/12/2011 [DATE])

 Order#:         [id number]
                 [id number]_O
Procedures

 US OB DETAIL + 14 WK                                  76811.0
 US UA CORD DOPPLER                                    76827.2
Indications

 Detailed fetal anatomic survey
 Alcohol Exposure
 Cannabis abuse
 Maternal drug exposure (amphetamines)
 Anemia
 Asthma
Fetal Evaluation

 Fetal Heart Rate:  163                          bpm
 Cardiac Activity:  Observed
 Presentation:      Cephalic
 Placenta:          Posterior Fundal, above
                    cervical os
 P. Cord            Visualized, central
 Insertion:

 Amniotic Fluid
 AFI FV:      Subjectively within normal limits
                                             Larg Pckt:     5.4  cm
Biometry

 BPD:     55.9  mm     G. Age:  23w 0d                CI:         77.7   70 - 86
 OFD:     71.9  mm                                    FL/HC:      18.9   19.2 -

 HC:     205.3  mm     G. Age:  22w 4d       19  %    HC/AC:      1.16   1.05 -

 AC:     177.2  mm     G. Age:  22w 4d       25  %    FL/BPD:     69.4   71 - 87
 FL:      38.8  mm     G. Age:  22w 3d       19  %    FL/AC:      21.9   20 - 24
 HUM:       37  mm     G. Age:  22w 6d       38  %
 CER:     27.6  mm     G. Age:  24w 6d       85  %

 Est. FW:     516  gm      1 lb 2 oz     40  %
Gestational Age

 LMP:           23w 1d        Date:  05/03/11                 EDD:   02/07/12
 U/S Today:     22w 4d                                        EDD:   02/11/12
 Best:          23w 1d     Det. By:  LMP  (05/03/11)          EDD:   02/07/12
Anatomy

 Cranium:           Appears normal      Aortic Arch:       Appears normal
 Fetal Cavum:       Appears normal      Ductal Arch:       Appears normal
 Ventricles:        Appears normal      Diaphragm:         Appears normal
 Choroid Plexus:    Appears normal      Stomach:           Appears normal
 Cerebellum:        Appears normal      Abdomen:           Appears normal
 Posterior Fossa:   Appears normal      Abdominal Wall:    Appears nml
                                                           (cord insert,
                                                           abd wall)
 Nuchal Fold:       Not applicable      Cord Vessels:      Appears normal
                    (>20 wks GA)                           (3 vessel cord)
 Face:              Appears normal      Kidneys:           Appear normal
                    (lips/profile/orbit
                    s)
 Heart:             Appears normal      Bladder:           Appears normal
                    (4 chamber &
                    axis)
 RVOT:              Appears normal      Spine:             Appears normal
 LVOT:              Appears normal      Limbs:             Appears normal
                                                           (hands, ankles,
                                                           feet)

 Other:     Fetus appears to be a female. Heels and 5th digit
            visualized. Technically difficult due to fetal position.
Targeted Anatomy

 Fetal Central Nervous System
 Cisterna Magna:
Doppler - Fetal Vessels

 Umbilical Artery
 S/D:   4.38           76  %tile       RI:
 PI:    1.38                           PSV:       -       cm/s

 Umbilical Artery
 Absent DFV:    No     Reverse DFV:    No

Cervix Uterus Adnexa

 Cervical Length:    4.6      cm

 Cervix:       Normal appearance by transabdominal scan.
 Left Ovary:    Within normal limits.
 Right Ovary:   Within normal limits.
 Adnexa:     No abnormality visualized.
Impression

 IUP at 23+1 weeks
 Normal detailed fetal anatomy
 Markers of aneuploidy: none
 Normal amniotic fluid volume
 Measurements consistent with LMP dating
Recommendations

 Fetal ECHO scheduled
 Follow-up ultrasound for growth in 4 weeks
 Please see genetic counseling note

## 2013-06-24 IMAGING — US US OB FOLLOW-UP
1 series · 12 of 28 positions shown · non-contrast
Comparison: none

[Series 1: us ob follow-up · 0.23mm/px · 12 of 34 slices shown]
[im 2/34]
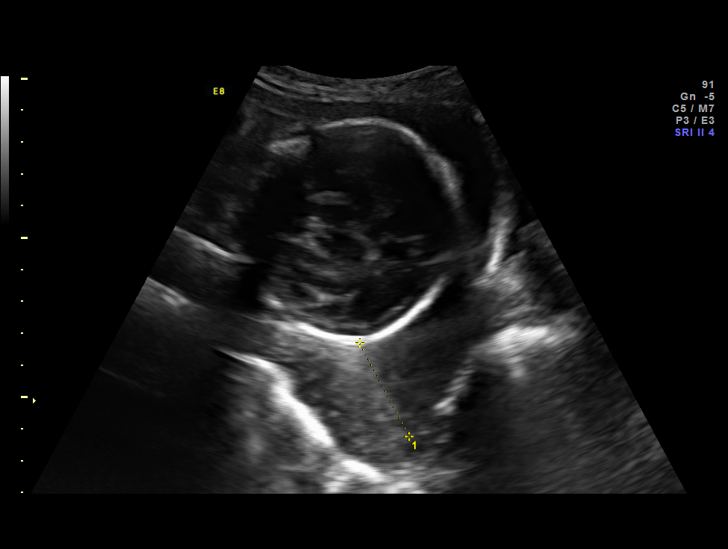
[im 4/34]
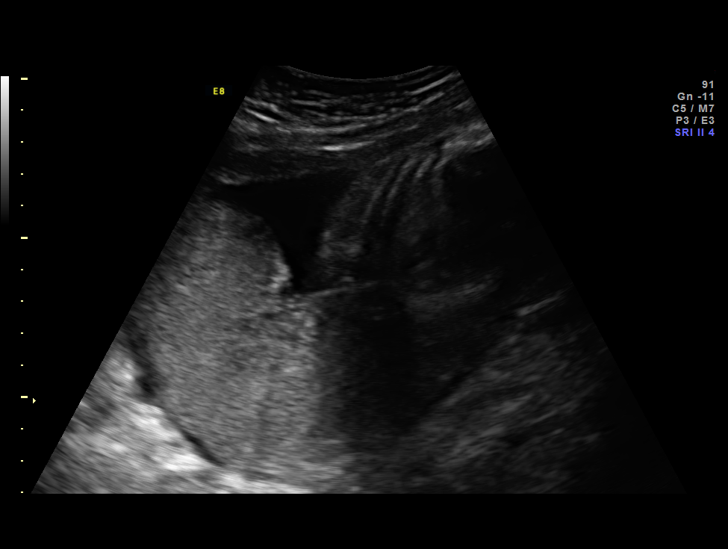
[im 7/34]
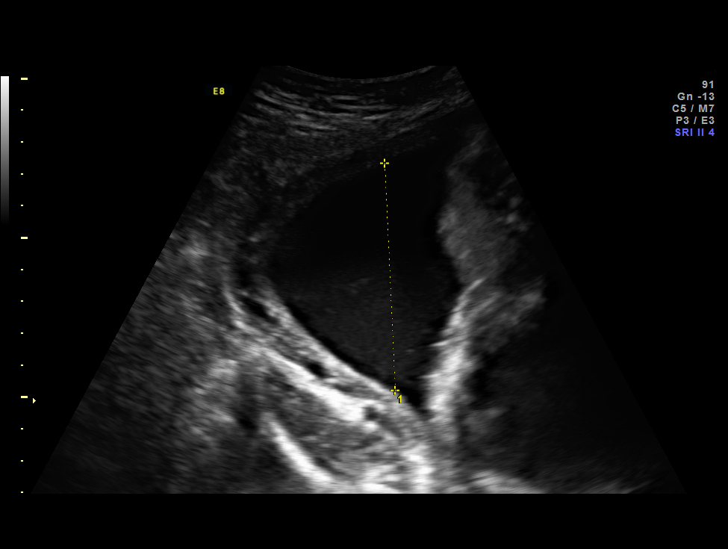
[im 10/34]
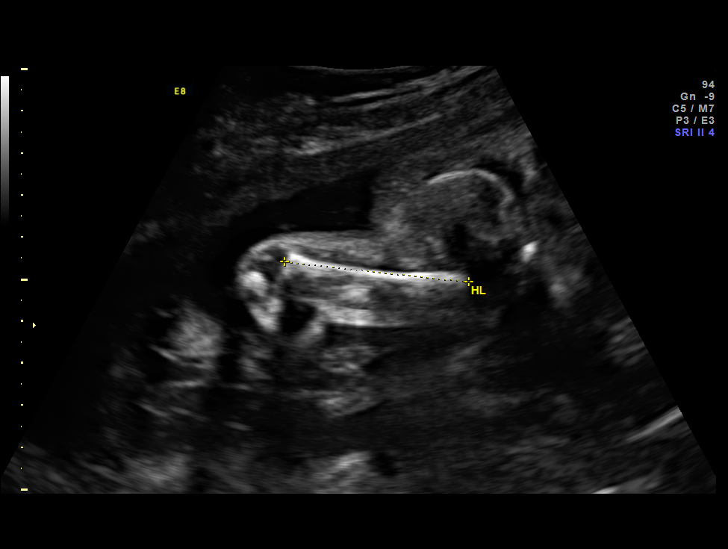
[im 13/34]
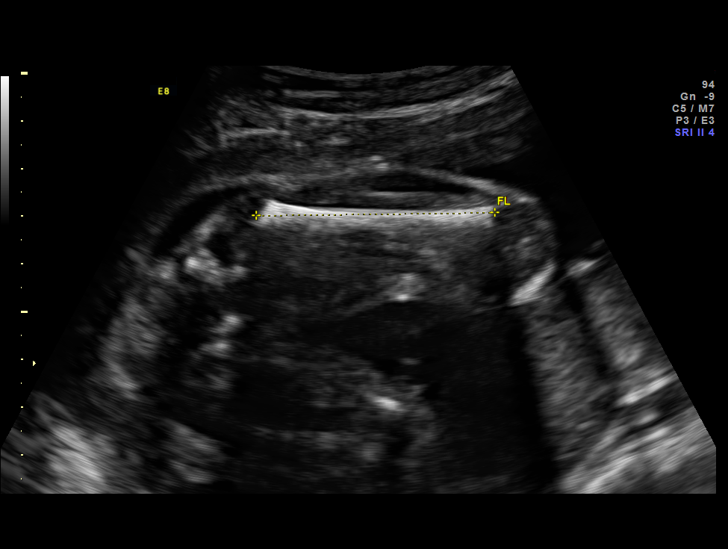
[im 15/34]
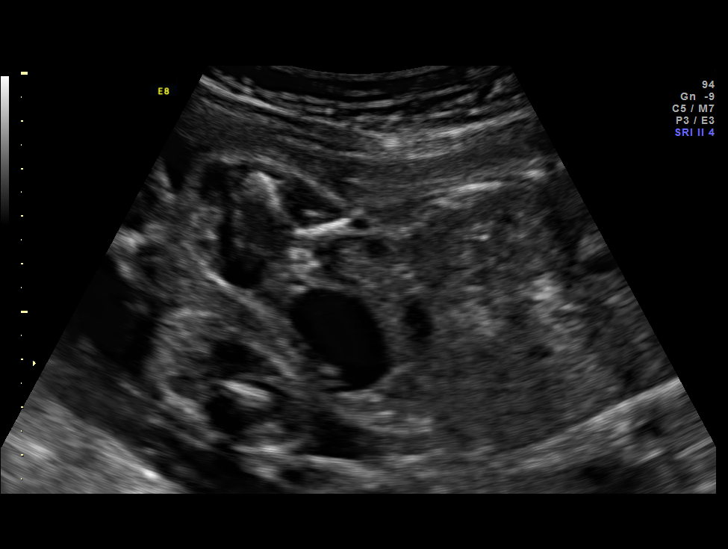
[im 19/34]
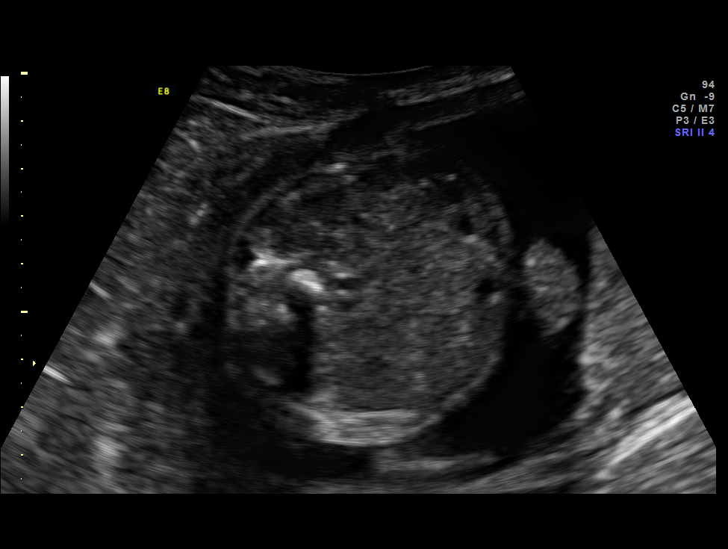
[im 21/34]
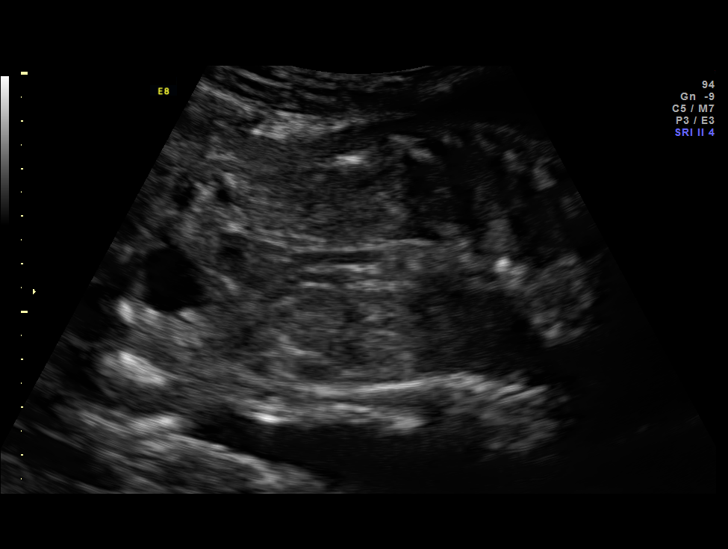
[im 24/34]
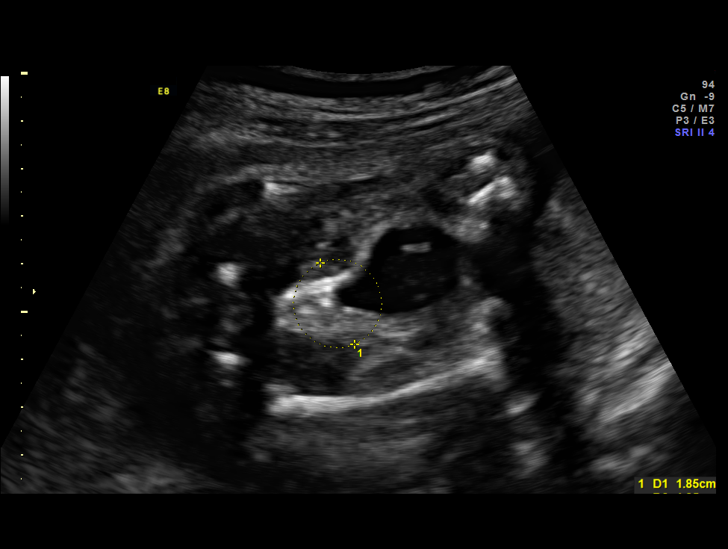
[im 27/34]
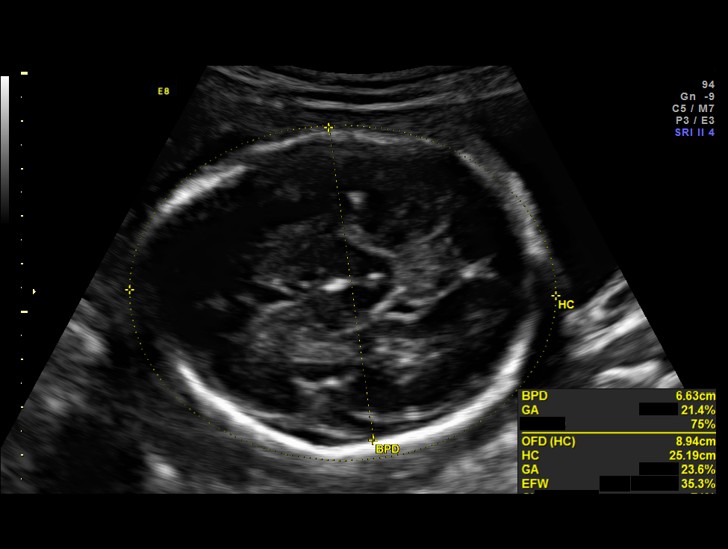
[im 30/34]
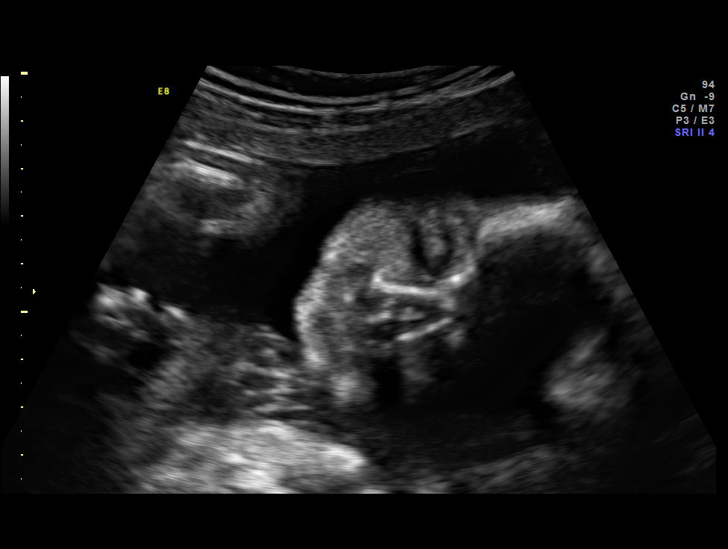
[im 32/34]
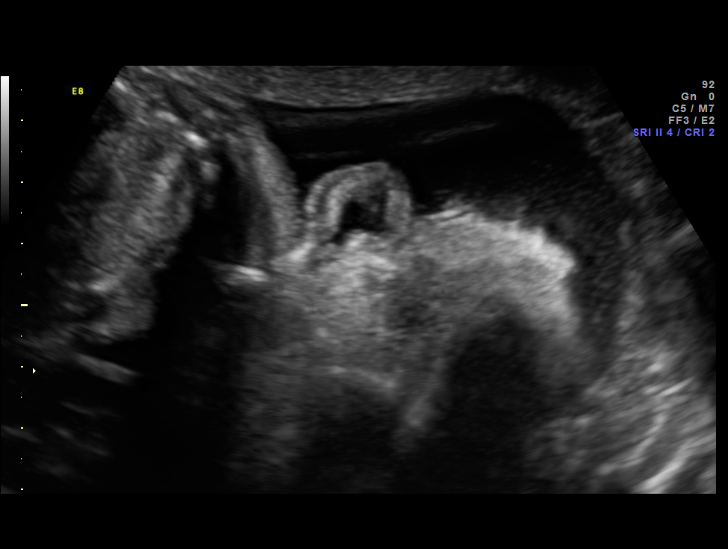

[12 of 28 positions shown; findings below may reference images not displayed]

OBSTETRICS REPORT
                      (Signed Final 11/09/2011 [DATE])

Service(s) Provided

 US OB FOLLOW UP                                       76816.1
Indications

 Alcohol Exposure
 Cannabis abuse
 Maternal drug exposure (amphetamines)
 Anemia
 Asthma
 No or Little Prenatal Care
 Assess Fetal Growth / Estimated Fetal Weight
Fetal Evaluation

 Num Of Fetuses:    1
 Fetal Heart Rate:  163                          bpm
 Cardiac Activity:  Observed
 Presentation:      Cephalic
 Placenta:          Posterior Fundal, above
                    cervical os
 P. Cord            Previously Visualized
 Insertion:

 Amniotic Fluid
 AFI FV:      Subjectively within normal limits
                                             Larg Pckt:     7.1  cm
Biometry

 BPD:       66  mm     G. Age:  26w 4d                CI:         73.1   70 - 86
 OFD:     90.3  mm                                    FL/HC:      19.9   18.6 -

 HC:       251  mm     G. Age:  27w 2d       25  %    HC/AC:      1.17   1.05 -

 AC:     213.8  mm     G. Age:  25w 6d       11  %    FL/BPD:     75.6   71 - 87
 FL:      49.9  mm     G. Age:  26w 6d       27  %    FL/AC:      23.3   20 - 24
 HUM:     44.5  mm     G. Age:  26w 3d       30  %

 Est. FW:     932  gm      2 lb 1 oz     37  %
Gestational Age

 LMP:           27w 1d        Date:  05/03/11                 EDD:   02/07/12
 U/S Today:     26w 5d                                        EDD:   02/10/12
 Best:          27w 1d     Det. By:  LMP  (05/03/11)          EDD:   02/07/12
Anatomy

 Cranium:          Previously seen        Ductal Arch:      Previously seen
 Fetal Cavum:      Previously seen        Diaphragm:        Appears normal
 Ventricles:       Previously seen        Stomach:          Appears normal, left
                                                            sided
 Choroid Plexus:   Previously seen        Abdomen:          Previously seen
 Cerebellum:       Previously seen        Abdominal Wall:   Previously seen
 Posterior Fossa:  Previously seen        Cord Vessels:     Previously seen
 Nuchal Fold:      Not applicable (>20    Kidneys:          Appear normal
                   wks GA)
 Face:             Orbits and profile     Bladder:          Appears normal
                   previously seen
 Lips:             Previously seen        Spine:            Previously seen
 Heart:            Previously seen        Lower             Previously seen
                                          Extremities:
 RVOT:             Previously seen        Upper             Previously seen
                                          Extremities:
 LVOT:             Previously seen        Limbs:            Appears normal
                                                            (hands, ankles, feet)
 Aortic Arch:      Previously seen

 Other:  Female gender. Heels and 5th digit previously seen.
Cervix Uterus Adnexa

 Cervical Length:    3.3      cm

 Cervix:       Normal appearance by transabdominal scan. Appears
               closed, without funnelling.
Impression

 IUP at 27+1 weeks
 Normal interval anatomy
 Normal amniotic fluid volume
 Interval growth appropriate (37th %tile)
Recommendations

 Fetal ECHO re-scheduled
 Follow-up ultrasound for growth in 4 weeks

## 2013-12-07 ENCOUNTER — Encounter: Payer: Self-pay | Admitting: Family Medicine

## 2015-04-10 ENCOUNTER — Inpatient Hospital Stay (HOSPITAL_COMMUNITY)
Admission: EM | Admit: 2015-04-10 | Discharge: 2015-04-11 | DRG: 776 | Disposition: A | Discharge status: Home / Self Care | Payer: Medicaid Other | Attending: Obstetrics & Gynecology | Admitting: Obstetrics & Gynecology

## 2015-04-10 ENCOUNTER — Emergency Department (HOSPITAL_COMMUNITY): Payer: Medicaid Other

## 2015-04-10 ENCOUNTER — Encounter (HOSPITAL_COMMUNITY): Payer: Self-pay | Admitting: Emergency Medicine

## 2015-04-10 DIAGNOSIS — N939 Abnormal uterine and vaginal bleeding, unspecified: Secondary | ICD-10-CM | POA: Diagnosis present

## 2015-04-10 DIAGNOSIS — D62 Acute posthemorrhagic anemia: Secondary | ICD-10-CM | POA: Diagnosis present

## 2015-04-10 DIAGNOSIS — T50905A Adverse effect of unspecified drugs, medicaments and biological substances, initial encounter: Secondary | ICD-10-CM | POA: Diagnosis not present

## 2015-04-10 DIAGNOSIS — O9081 Anemia of the puerperium: Secondary | ICD-10-CM | POA: Diagnosis present

## 2015-04-10 DIAGNOSIS — N920 Excessive and frequent menstruation with regular cycle: Secondary | ICD-10-CM | POA: Diagnosis present

## 2015-04-10 DIAGNOSIS — Y92230 Patient room in hospital as the place of occurrence of the external cause: Secondary | ICD-10-CM | POA: Diagnosis not present

## 2015-04-10 DIAGNOSIS — Z87891 Personal history of nicotine dependence: Secondary | ICD-10-CM

## 2015-04-10 LAB — I-STAT BETA HCG BLOOD, ED (MC, WL, AP ONLY): I-stat hCG, quantitative: 6.6 m[IU]/mL — ABNORMAL HIGH (ref <5)

## 2015-04-10 LAB — RAPID URINE DRUG SCREEN, HOSP PERFORMED
AMPHETAMINES: NOT DETECTED
BENZODIAZEPINES: NOT DETECTED
Barbiturates: NOT DETECTED
COCAINE: NOT DETECTED
Opiates: POSITIVE — AB
TETRAHYDROCANNABINOL: POSITIVE — AB

## 2015-04-10 LAB — I-STAT CHEM 8, ED
BUN: 6 mg/dL (ref 6–20)
CHLORIDE: 98 mmol/L — AB (ref 101–111)
CREATININE: 0.6 mg/dL (ref 0.44–1.00)
Calcium, Ion: 1.16 mmol/L (ref 1.12–1.23)
Glucose, Bld: 97 mg/dL (ref 65–99)
HEMATOCRIT: 33 % — AB (ref 36.0–46.0)
Hemoglobin: 11.2 g/dL — ABNORMAL LOW (ref 12.0–15.0)
POTASSIUM: 3.3 mmol/L — AB (ref 3.5–5.1)
Sodium: 135 mmol/L (ref 135–145)
TCO2: 21 mmol/L (ref 0–100)

## 2015-04-10 LAB — WET PREP, GENITAL
CLUE CELLS WET PREP: NONE SEEN
SPERM: NONE SEEN
TRICH WET PREP: NONE SEEN
YEAST WET PREP: NONE SEEN

## 2015-04-10 LAB — CBC
HEMATOCRIT: 20.3 % — AB (ref 36.0–46.0)
HEMOGLOBIN: 7 g/dL — AB (ref 12.0–15.0)
MCH: 29.7 pg (ref 26.0–34.0)
MCHC: 34.5 g/dL (ref 30.0–36.0)
MCV: 86 fL (ref 78.0–100.0)
Platelets: 191 10*3/uL (ref 150–400)
RBC: 2.36 MIL/uL — AB (ref 3.87–5.11)
RDW: 16.4 % — ABNORMAL HIGH (ref 11.5–15.5)
WBC: 7.4 10*3/uL (ref 4.0–10.5)

## 2015-04-10 LAB — CBC WITH DIFFERENTIAL/PLATELET
BASOS ABS: 0 10*3/uL (ref 0.0–0.1)
Basophils Relative: 0 %
EOS PCT: 1 %
Eosinophils Absolute: 0.1 10*3/uL (ref 0.0–0.7)
HCT: 28.5 % — ABNORMAL LOW (ref 36.0–46.0)
Hemoglobin: 9.6 g/dL — ABNORMAL LOW (ref 12.0–15.0)
LYMPHS ABS: 2.6 10*3/uL (ref 0.7–4.0)
LYMPHS PCT: 28 %
MCH: 29.1 pg (ref 26.0–34.0)
MCHC: 33.7 g/dL (ref 30.0–36.0)
MCV: 86.4 fL (ref 78.0–100.0)
MONO ABS: 0.7 10*3/uL (ref 0.1–1.0)
MONOS PCT: 7 %
Neutro Abs: 6.1 10*3/uL (ref 1.7–7.7)
Neutrophils Relative %: 64 %
Platelets: 292 10*3/uL (ref 150–400)
RBC: 3.3 MIL/uL — ABNORMAL LOW (ref 3.87–5.11)
RDW: 16.5 % — AB (ref 11.5–15.5)
WBC: 9.5 10*3/uL (ref 4.0–10.5)

## 2015-04-10 LAB — BASIC METABOLIC PANEL
Anion gap: 11 (ref 5–15)
BUN: 8 mg/dL (ref 6–20)
CALCIUM: 8.7 mg/dL — AB (ref 8.9–10.3)
CO2: 23 mmol/L (ref 22–32)
CREATININE: 0.73 mg/dL (ref 0.44–1.00)
Chloride: 100 mmol/L — ABNORMAL LOW (ref 101–111)
GFR calc Af Amer: >60 mL/min (ref >60)
GLUCOSE: 97 mg/dL (ref 65–99)
Potassium: 3.3 mmol/L — ABNORMAL LOW (ref 3.5–5.1)
Sodium: 134 mmol/L — ABNORMAL LOW (ref 135–145)

## 2015-04-10 LAB — POC URINE PREG, ED: Preg Test, Ur: POSITIVE — AB

## 2015-04-10 MED ORDER — SODIUM CHLORIDE 0.9 % IV BOLUS (SEPSIS)
1000.0000 mL | Freq: Once | INTRAVENOUS | Status: AC
  Administered 2015-04-10: 1000 mL via INTRAVENOUS

## 2015-04-10 MED ORDER — OXYCODONE HCL 5 MG PO TABS
5.0000 mg | ORAL_TABLET | Freq: Once | ORAL | Status: AC
Start: 2015-04-10 — End: 2015-04-10
  Administered 2015-04-10: 5 mg via ORAL
  Filled 2015-04-10: qty 1

## 2015-04-10 MED ORDER — SODIUM CHLORIDE 0.9 % IV BOLUS (SEPSIS)
2000.0000 mL | Freq: Once | INTRAVENOUS | Status: AC
  Administered 2015-04-10: 2000 mL via INTRAVENOUS

## 2015-04-10 MED ORDER — SODIUM CHLORIDE 0.9 % IV SOLN
Freq: Once | INTRAVENOUS | Status: AC
  Administered 2015-04-11: 01:00:00 via INTRAVENOUS

## 2015-04-10 MED ORDER — ESTROGENS CONJUGATED 25 MG IJ SOLR
25.0000 mg | Freq: Once | INTRAMUSCULAR | Status: AC
  Administered 2015-04-11: 25 mg via INTRAVENOUS
  Filled 2015-04-10: qty 25

## 2015-04-10 MED ORDER — ACETAMINOPHEN 500 MG PO TABS
1000.0000 mg | ORAL_TABLET | Freq: Once | ORAL | Status: AC
  Administered 2015-04-10: 1000 mg via ORAL
  Filled 2015-04-10: qty 2

## 2015-04-10 NOTE — ED Notes (Signed)
Pt able to steadily ambulated to the bathroom to obtain a urine sample.

## 2015-04-10 NOTE — ED Provider Notes (Signed)
CSN: [id number]     Arrival date & time 04/10/15  1920 History   First MD Initiated Contact with Patient 04/10/15 1945     Chief Complaint  Patient presents with  . Vaginal Bleeding     (Consider location/radiation/quality/duration/timing/severity/associated sxs/prior Treatment) Patient is a 35 y.o. female presenting with vaginal bleeding. The history is provided by the patient.  Vaginal Bleeding Quality:  Bright red and clots Severity:  Severe Onset quality:  Sudden Duration:  1 day Timing:  Constant Progression:  Worsening Chronicity:  New Possible pregnancy: no   Relieved by:  Nothing Worsened by:  Nothing tried Ineffective treatments:  None tried Associated symptoms: abdominal pain   Associated symptoms: no dizziness, no dysuria, no fever and no nausea    35 yo F with a chief complaint of vaginal bleeding. The started today. Having a couple hours ago started having a brisk bleeding at one point had to sit over the toilet and just watch the blood go in. Patient been feeling lightheaded and dizzy like she is to pass out. Recently gave birth 2 weeks ago. [facility] the [facility] risk [facility] fetal [facility]. Denies palpitations with delivery. There are some issues with her newborn his currently in the hospital. Patient had some mild bleeding that had been getting better until today. Denies anything intravaginally.  Past Medical History  Diagnosis Date  . Environmental allergies   . Depression 2012    PP. TOOK MEDS  . Anxiety 2012  . Abnormal Pap smear 2011    REPEATED; LAST PAP 02/2010  . Asthma     CHILDHOOD  . Infection     UTI X1  . Infection     yeast x1  . Anemia     CHRONIC   Past Surgical History  Procedure Laterality Date  . Bartholin gland cyst excision    . No past surgeries     Family History  Problem Relation Age of Onset  . Depression Mother   . Diabetes Mother   . Hypertension Mother   . Alcohol abuse Father   . Cirrhosis Father    . Hypertension Father   . Asthma Sister   . Diabetes Sister   . Diabetes Maternal Aunt   . Hypertension Maternal Aunt   . Diabetes Maternal Grandmother   . Cancer Maternal Aunt 43    BREAST  . Diabetes Maternal Aunt    Social History  Substance Use Topics  . Smoking status: Former Smoker    Types: Cigarettes    Quit date: 03/08/2009  . Smokeless tobacco: Never Used  . Alcohol Use: 7.0 - 12.0 oz/week    14-24 drink(s) per week     Comment: WINE; D/C'D 09/06/2011   OB History    Gravida Para Term Preterm AB TAB SAB Ectopic Multiple Living   2 1 1  0 0 0 0 0 0 1      Obstetric Comments   PP DEPRESSION; +GBS     Review of Systems  Constitutional: Negative for fever and chills.  HENT: Negative for congestion and rhinorrhea.   Eyes: Negative for redness and visual disturbance.  Respiratory: Negative for shortness of breath and wheezing.   Cardiovascular: Negative for chest pain and palpitations.  Gastrointestinal: Positive for abdominal pain. Negative for nausea and vomiting.  Genitourinary: Positive for vaginal bleeding and pelvic pain. Negative for dysuria and urgency.  Musculoskeletal: Negative for myalgias and arthralgias.  Skin: Negative for pallor and wound.  Neurological: Negative for  dizziness and headaches.      Allergies  Betadine; Shellfish allergy; and Pollen extract  Home Medications   Prior to Admission medications   Medication Sig Start Date End Date Taking? Authorizing Provider  ibuprofen (ADVIL,MOTRIN) 800 MG tablet Take 800 mg by mouth every 8 (eight) hours as needed for moderate pain.   Yes Historical Provider, MD  Prenatal MV-Min-Fe Fum-FA-DHA (VITAFOL-OB+DHA) 65-1 & 250 MG MISC Take 1 tablet by mouth daily. 10/01/11  Yes [name], CNM  acetaminophen (TYLENOL) 500 MG tablet Take 1,000 mg by mouth daily as needed. For pain    Historical Provider, MD  EPINEPHrine (EPIPEN) 0.3 mg/0.3 mL DEVI Inject 0.3 mLs (0.3 mg total) into the muscle  once. Patient not taking: Reported on 04/10/2015 10/30/10   Geryl Rankins, MD  ranitidine (ZANTAC) 150 MG tablet Take 1 tablet (150 mg total) by mouth at bedtime as needed for heartburn. 12/06/11   Napoleon Form, MD   BP 117/79 mmHg  Pulse 106  Resp 14  SpO2 100%  Breastfeeding? Unknown Physical Exam  Constitutional: She is oriented to person, place, and time. She appears well-developed and well-nourished. No distress.  HENT:  Head: Normocephalic and atraumatic.  Eyes: EOM are normal. Pupils are equal, round, and reactive to light.  Neck: Normal range of motion. Neck supple.  Cardiovascular: Normal rate and regular rhythm.  Exam reveals no gallop and no friction rub.   No murmur heard. Pulmonary/Chest: Effort normal. She has no wheezes. She has no rales.  Abdominal: Soft. She exhibits no distension. There is no tenderness.  Genitourinary:  Large amount of blood in the vault.   Musculoskeletal: She exhibits no edema or tenderness.  Neurological: She is alert and oriented to person, place, and time.  Skin: Skin is warm and dry. She is not diaphoretic.  Psychiatric: She has a normal mood and affect. Her behavior is normal.  Nursing note and vitals reviewed.   ED Course  Procedures (including critical care time) Labs Review Labs Reviewed  WET PREP, GENITAL - Abnormal; Notable for the following:    WBC, Wet Prep HPF POC FEW (*)    All other components within normal limits  CBC WITH DIFFERENTIAL/PLATELET - Abnormal; Notable for the following:    RBC 3.30 (*)    Hemoglobin 9.6 (*)    HCT 28.5 (*)    RDW 16.5 (*)    All other components within normal limits  BASIC METABOLIC PANEL - Abnormal; Notable for the following:    Sodium 134 (*)    Potassium 3.3 (*)    Chloride 100 (*)    Calcium 8.7 (*)    All other components within normal limits  URINE RAPID DRUG SCREEN, HOSP PERFORMED - Abnormal; Notable for the following:    Opiates POSITIVE (*)    Tetrahydrocannabinol POSITIVE (*)     All other components within normal limits  I-STAT CHEM 8, ED - Abnormal; Notable for the following:    Potassium 3.3 (*)    Chloride 98 (*)    Hemoglobin 11.2 (*)    HCT 33.0 (*)    All other components within normal limits  POC URINE PREG, ED - Abnormal; Notable for the following:    Preg Test, Ur POSITIVE (*)    All other components within normal limits  I-STAT BETA HCG BLOOD, ED ([facility], [facility], [facility] ONLY) - Abnormal; Notable for the following:    I-stat hCG, quantitative 6.6 (*)    All other components within normal limits  BETA HCG, QUANT  TYPE AND SCREEN  ABO/RH  GC/CHLAMYDIA PROBE AMP ([place]) NOT AT [facility]    Imaging Review No results found. I have personally reviewed and evaluated these images and lab results as part of my medical decision-making.   EKG Interpretation   Date/Time:  Sunday April 10 2015 20:24:19 EST Ventricular Rate:  112 PR Interval:  141 QRS Duration: 74 QT Interval:  325 QTC Calculation: 444 R Axis:   64 Text Interpretation:  Sinus tachycardia No old tracing to compare  Confirmed by Dantavious Snowball MD, DANIEL [phone]) on 04/10/2015 9:51:03 PM      MDM   Final diagnoses:  Vaginal bleeding    35 yo F with the chief complaint of vaginal bleeding. Per patient she had a significant amount of bleeding. Patient with clots in the vault. About 60 mL of blood was suctioned out. Tachycardic into the 140's on arrival.  Given iv fluids, type and cross.  OB consult.   OB recommend [place].  Poc preg + hcg 6.  [place] with no retained products  Repeat hbg 7.  Given 2 units of blood.  Discussed with OB, will transfer to womens.    CRITICAL CARE Performed by: Rae Roam   Total critical care time: 76 minutes  Critical care time was exclusive of separately billable procedures and treating other patients.  Critical care was necessary to treat or prevent imminent or life-threatening deterioration.  Critical care was time spent personally by me on the following  activities: development of treatment plan with patient and/or surrogate as well as nursing, discussions with consultants, evaluation of patient's response to treatment, examination of patient, obtaining history from patient or surrogate, ordering and performing treatments and interventions, ordering and review of laboratory studies, ordering and review of radiographic studies, pulse oximetry and re-evaluation of patient's condition.  The patients results and plan were reviewed and discussed.   Any x-rays performed were independently reviewed by myself.   Differential diagnosis were considered with the presenting HPI.  Medications  ibuprofen (ADVIL,MOTRIN) tablet 800 mg (800 mg Oral Given 04/11/15 1343)  famotidine (PEPCID) tablet 20 mg (20 mg Oral Given 04/11/15 0950)  lactated ringers infusion ( Intravenous New Bag/Given 04/11/15 1344)  docusate sodium (COLACE) capsule 100 mg (100 mg Oral Given 04/11/15 0950)  magnesium hydroxide (MILK OF MAGNESIA) suspension 30 mL (not administered)  bisacodyl (DULCOLAX) EC tablet 5 mg (not administered)  magnesium citrate solution 1 Bottle (not administered)  alum & mag hydroxide-simeth (MAALOX/MYLANTA) 200-200-20 MG/5ML suspension 30 mL (not administered)  ondansetron (ZOFRAN) tablet 4 mg (not administered)    Or  ondansetron (ZOFRAN) injection 4 mg (not administered)  diphenhydrAMINE (BENADRYL) injection 50 mg (not administered)  HYDROcodone-acetaminophen (NORCO/VICODIN) 5-325 MG per tablet 1-2 tablet (1 tablet Oral Given 04/11/15 0950)  sodium chloride 0.9 % bolus 2,000 mL (0 mLs Intravenous Stopped 04/10/15 2137)  oxyCODONE (Oxy IR/ROXICODONE) immediate release tablet 5 mg (5 mg Oral Given 04/10/15 2044)  acetaminophen (TYLENOL) tablet 1,000 mg (1,000 mg Oral Given 04/10/15 2044)  sodium chloride 0.9 % bolus 1,000 mL (0 mLs Intravenous Stopped 04/11/15 0050)  0.9 %  sodium chloride infusion ( Intravenous New Bag/Given 04/11/15 0057)  conjugated estrogens (PREMARIN)  injection 25 mg (25 mg Intravenous Given 04/11/15 0026)  oxyCODONE (Oxy IR/ROXICODONE) immediate release tablet 5 mg (5 mg Oral Given 04/11/15 0139)  diphenhydrAMINE (BENADRYL) 50 MG/ML injection (50 mg  Given 04/11/15 0611)  diphenhydrAMINE (BENADRYL) injection 50 mg (50 mg Intravenous Given 04/11/15 0634)  methylPREDNISolone sodium succinate (SOLU-MEDROL) 125 mg/2 mL injection 125 mg (125 mg Intravenous Given 04/11/15 0633)    Filed Vitals:   04/11/15 0335 04/11/15 0603 04/11/15 0630 04/11/15 1059  BP: 113/74 110/87 122/83 117/83  Pulse: 86 116 94 111  Temp: 99.2 F (37.3 C) 98.7 F (37.1 C) 98.8 F (37.1 C) 98.5 F (36.9 C)  TempSrc: Oral Oral Oral Oral  Resp: 17 16 18 18   Height: 5\' 6"  (1.676 m)     Weight: 140 lb (63.504 kg)     SpO2: 100% 100% 100% 100%    Final diagnoses:  Vaginal bleeding    Admission/ observation were discussed with the admitting physician, patient and/or family and they are comfortable with the plan.     Melene Plan, DO 04/11/15 1601

## 2015-04-10 NOTE — ED Notes (Signed)
MD at bedside. 

## 2015-04-10 NOTE — ED Notes (Signed)
Pt states she had a baby 2 weeks ago and today started having vaginal bleeding with blood clots. States she has soaked 5 large hospital pads. Reports being light headed. Alert and oriented.

## 2015-04-11 ENCOUNTER — Encounter (HOSPITAL_COMMUNITY): Payer: Self-pay | Admitting: *Deleted

## 2015-04-11 ENCOUNTER — Inpatient Hospital Stay (HOSPITAL_COMMUNITY): Payer: Medicaid Other

## 2015-04-11 DIAGNOSIS — N920 Excessive and frequent menstruation with regular cycle: Secondary | ICD-10-CM | POA: Diagnosis present

## 2015-04-11 DIAGNOSIS — Y92230 Patient room in hospital as the place of occurrence of the external cause: Secondary | ICD-10-CM | POA: Diagnosis not present

## 2015-04-11 DIAGNOSIS — Z87891 Personal history of nicotine dependence: Secondary | ICD-10-CM | POA: Diagnosis not present

## 2015-04-11 DIAGNOSIS — O9081 Anemia of the puerperium: Secondary | ICD-10-CM | POA: Diagnosis present

## 2015-04-11 DIAGNOSIS — N939 Abnormal uterine and vaginal bleeding, unspecified: Secondary | ICD-10-CM | POA: Diagnosis present

## 2015-04-11 DIAGNOSIS — T50905A Adverse effect of unspecified drugs, medicaments and biological substances, initial encounter: Secondary | ICD-10-CM | POA: Diagnosis not present

## 2015-04-11 DIAGNOSIS — D62 Acute posthemorrhagic anemia: Secondary | ICD-10-CM | POA: Diagnosis present

## 2015-04-11 LAB — HCG, QUANTITATIVE, PREGNANCY: hCG, Beta Chain, Quant, S: 6 m[IU]/mL — ABNORMAL HIGH (ref <5)

## 2015-04-11 LAB — ABO/RH: ABO/RH(D): O POS

## 2015-04-11 LAB — HEMOGLOBIN AND HEMATOCRIT, BLOOD
HEMATOCRIT: 22.2 % — AB (ref 36.0–46.0)
HEMOGLOBIN: 7.7 g/dL — AB (ref 12.0–15.0)

## 2015-04-11 LAB — GC/CHLAMYDIA PROBE AMP (~~LOC~~) NOT AT ARMC
CHLAMYDIA, DNA PROBE: NEGATIVE
NEISSERIA GONORRHEA: NEGATIVE

## 2015-04-11 LAB — PREPARE RBC (CROSSMATCH)

## 2015-04-11 MED ORDER — HYDROCODONE-ACETAMINOPHEN 5-325 MG PO TABS: 1.0000 | ORAL_TABLET | Freq: Four times a day (QID) | ORAL | Status: AC | PRN

## 2015-04-11 MED ORDER — DIPHENHYDRAMINE HCL 50 MG/ML IJ SOLN
50.0000 mg | Freq: Once | INTRAMUSCULAR | Status: AC
  Administered 2015-04-11: 50 mg via INTRAVENOUS
  Filled 2015-04-11: qty 1

## 2015-04-11 MED ORDER — MAGNESIUM CITRATE PO SOLN
1.0000 | Freq: Once | ORAL | Status: DC | PRN
Start: 2015-04-11 — End: 2015-04-11
  Filled 2015-04-11: qty 296

## 2015-04-11 MED ORDER — METHYLPREDNISOLONE SODIUM SUCC 125 MG IJ SOLR
125.0000 mg | Freq: Once | INTRAMUSCULAR | Status: AC
  Administered 2015-04-11: 125 mg via INTRAVENOUS
  Filled 2015-04-11: qty 2

## 2015-04-11 MED ORDER — OXYCODONE HCL 5 MG PO TABS
5.0000 mg | ORAL_TABLET | Freq: Once | ORAL | Status: AC
  Administered 2015-04-11: 5 mg via ORAL
  Filled 2015-04-11: qty 1

## 2015-04-11 MED ORDER — DOCUSATE SODIUM 100 MG PO CAPS
100.0000 mg | ORAL_CAPSULE | Freq: Two times a day (BID) | ORAL | Status: DC
  Administered 2015-04-11 (×2): 100 mg via ORAL
  Filled 2015-04-11 (×2): qty 1

## 2015-04-11 MED ORDER — IBUPROFEN 800 MG PO TABS
800.0000 mg | ORAL_TABLET | Freq: Three times a day (TID) | ORAL | Status: DC | PRN
  Administered 2015-04-11 (×2): 800 mg via ORAL
  Filled 2015-04-11 (×2): qty 1

## 2015-04-11 MED ORDER — METHYLERGONOVINE MALEATE 0.2 MG PO TABS: 0.2000 mg | ORAL_TABLET | Freq: Four times a day (QID) | ORAL | Status: AC

## 2015-04-11 MED ORDER — LACTATED RINGERS IV SOLN
INTRAVENOUS | Status: DC
Start: 2015-04-11 — End: 2015-04-11
  Administered 2015-04-11 (×2): via INTRAVENOUS

## 2015-04-11 MED ORDER — BISACODYL 5 MG PO TBEC
5.0000 mg | DELAYED_RELEASE_TABLET | Freq: Every day | ORAL | Status: DC | PRN
  Filled 2015-04-11: qty 1

## 2015-04-11 MED ORDER — HYDROCODONE-ACETAMINOPHEN 5-325 MG PO TABS
1.0000 | ORAL_TABLET | Freq: Four times a day (QID) | ORAL | Status: DC | PRN
  Administered 2015-04-11: 1 via ORAL
  Filled 2015-04-11: qty 1

## 2015-04-11 MED ORDER — DIMENHYDRINATE 50 MG/ML IJ SOLN
50.0000 mg | Freq: Once | INTRAMUSCULAR | Status: DC
  Filled 2015-04-11: qty 1

## 2015-04-11 MED ORDER — ALUM & MAG HYDROXIDE-SIMETH 200-200-20 MG/5ML PO SUSP: 30.0000 mL | ORAL | Status: DC | PRN

## 2015-04-11 MED ORDER — METHYLPREDNISOLONE SODIUM SUCC 125 MG IJ SOLR: 125.0000 mg | Freq: Once | INTRAMUSCULAR | Status: DC

## 2015-04-11 MED ORDER — ONDANSETRON HCL 4 MG/2ML IJ SOLN: 4.0000 mg | Freq: Four times a day (QID) | INTRAMUSCULAR | Status: DC | PRN

## 2015-04-11 MED ORDER — ONDANSETRON HCL 4 MG PO TABS: 4.0000 mg | ORAL_TABLET | Freq: Four times a day (QID) | ORAL | Status: DC | PRN

## 2015-04-11 MED ORDER — EPINEPHRINE 0.3 MG/0.3ML IJ DEVI: 0.3000 mg | Freq: Once | INTRAMUSCULAR | Status: AC

## 2015-04-11 MED ORDER — DIPHENHYDRAMINE HCL 50 MG/ML IJ SOLN
INTRAMUSCULAR | Status: AC
  Administered 2015-04-11: 50 mg
  Filled 2015-04-11: qty 1

## 2015-04-11 MED ORDER — FAMOTIDINE 20 MG PO TABS
20.0000 mg | ORAL_TABLET | Freq: Every day | ORAL | Status: DC
  Administered 2015-04-11: 20 mg via ORAL
  Filled 2015-04-11: qty 1

## 2015-04-11 MED ORDER — MAGNESIUM HYDROXIDE 400 MG/5ML PO SUSP: 30.0000 mL | Freq: Every day | ORAL | Status: DC | PRN

## 2015-04-11 MED ORDER — DIPHENHYDRAMINE HCL 50 MG/ML IJ SOLN: 50.0000 mg | Freq: Once | INTRAMUSCULAR | Status: DC

## 2015-04-11 NOTE — Progress Notes (Signed)
0600 Patient called c/o itching and noticed welts on face and later in neck, back and arms..  Denies difficulty of breathing, patient looks anxious.  RRRN in room with [place] on standby.  Dr. Jonathon JordanGambino updated and requested to see patient.  Benadryl 50 mgs IV given stat @0611 .  DR. Gambino at bedside followed by Dr. Penne LashLeggett.   [id number] Solumedro 125mg s IV given . Patient's lips swollen, welts still noted on face and body.  O2 at 2l. Sats=% [id number] Benadryl 50 mgs  2nd dose given.  Patients claims." feeling better and less itching.   Awaiting transvaginal USS.  Endorsed care,

## 2015-04-11 NOTE — Discharge Summary (Signed)
OB Discharge Summary     Patient Name: Andrea Hanson DOB: [birth year] MRN: [id number]  Date of admission: 04/10/2015 Delivering MD: This patient has no babies on file.  Date of discharge: 04/11/2015  Admitting diagnosis: Vaginal bleeding [N93.9] Intrauterine pregnancy: Unknown     Secondary diagnosis:  Principal Problem:   Excessive vaginal bleeding Active Problems:   Secondary postpartum hemorrhage  Additional problems: Secondary Postpartum hemorrhage     Discharge diagnosis: same                                                                                              Hospital course:  This is a 35yo [id number] who is approximately 2 weeks postpartum.  [facility]  [facility].  US done showed clots in the Uterus, but no retained POC.  She was given Premarin.  She passed several clots, then her bleeding became minimal.  She will be started on methergine and will be discharged to home to follow up in the clinic.  Physical exam  Filed Vitals:   04/11/15 0335 04/11/15 0603 04/11/15 0630 04/11/15 1059  BP: 113/74 110/87 122/83 117/83  Pulse: 86 116 94 111  Temp: 99.2 F (37.3 C) 98.7 F (37.1 C) 98.8 F (37.1 C) 98.5 F (36.9 C)  TempSrc: Oral Oral Oral Oral  Resp: 17 16 18 18   Height: 5\' 6"  (1.676 m)     Weight: 140 lb (63.504 kg)     SpO2: 100% 100% 100% 100%   Exam unchanged from admission.  See Dr Leggett's note.  Labs: Lab Results  Component Value Date   WBC 7.4 04/10/2015   HGB 7.7* 04/11/2015   HCT 22.2* 04/11/2015   MCV 86.0 04/10/2015   PLT 191 04/10/2015   CMP Latest Ref Rng 04/10/2015  Glucose 65 - 99 mg/dL 97  BUN 6 - 20 mg/dL 6  Creatinine 4.090.44 - 8.111.00 mg/dL 9.140.60  Sodium 782135 - 956145 mmol/L 135  Potassium 3.5 - 5.1 mmol/L 3.3(L)  Chloride 101 - 111 mmol/L 98(L)  CO2 22 - 32 mmol/L -  Calcium 8.9 - 10.3 mg/dL -    Discharge instruction: per After Visit Summary and "Baby and Me  Booklet".  After visit meds:    Medication List    TAKE these medications        acetaminophen 500 MG tablet  Commonly known as:  TYLENOL  Take 1,000 mg by mouth daily as needed. For pain     EPINEPHrine 0.3 mg/0.3 mL Devi  Commonly known as:  EPIPEN  Inject 0.3 mLs (0.3 mg total) into the muscle once.     HYDROcodone-acetaminophen 5-325 MG tablet  Commonly known as:  NORCO/VICODIN  Take 1 tablet by mouth every 6 (six) hours as needed for moderate pain.     ibuprofen 800 MG tablet  Commonly known as:  ADVIL,MOTRIN  Take 800 mg by mouth every 8 (eight) hours as needed for moderate pain.     methylergonovine 0.2 MG tablet  Commonly known as:  METHERGINE  Take 1 tablet (0.2 mg  total) by mouth every 6 (six) hours.     ranitidine 150 MG tablet  Commonly known as:  ZANTAC  Take 1 tablet (150 mg total) by mouth at bedtime as needed for heartburn.     VITAFOL-OB+DHA 65-1 & 250 MG Misc  Take 1 tablet by mouth daily.        Diet: routine diet  Activity: Advance as tolerated. Pelvic rest for 6 weeks.   Outpatient follow up:2 weeks Follow up Appt:No future appointments. Follow up Visit:No Follow-up on file.   04/11/2015 STINSON, JACOB JEHIEL, DO

## 2015-04-11 NOTE — ED Notes (Signed)
Andrea Hanson was given report at womens (room 316) and [facility] is in route

## 2015-04-11 NOTE — Progress Notes (Addendum)
Interim Progress Note  S: Called to bedside by RN. Pt has diffuse wheals on body and angioedema from allergic reaction. Pt states "This happens every time after I have a baby". No shortness of breath or wheezing. No dysphagia.   O: BP 110/87 mmHg  Pulse 116  Temp(Src) 98.7 F (37.1 C) (Oral)  Resp 16  Ht 5\' 6"  (1.676 m)  Wt 63.504 kg (140 lb)  BMI 22.61 kg/m2  SpO2 100%  Breastfeeding? Unknown  HEENT: angioedema present, mostly on forehead and lips Cardio: RRR, normal s1 and s2, no murmurs Resp: Normal work of breathing, clear to auscultation bilaterally Skin: Wheals diffusely present from abdomen up to face. No wheals on legs.  Abd:  Soft, non tender GU:  Brown staining on pad Extremities:  No edema, homan's negative  A/P: Allergic Rxn: Likely secondary to medication. Possible [facility]. Of note she also just had a blood transfusion.  - IV Benadryl 50mg  x 2 - IV Solumedrol 125mg  - Epi on stand by  - Continuous pulse ox - Monitor vitals and signs of anaphylaxis  Beaulah Dinninghristina M Gambino, MD 04/11/2015, 6:32 AM PGY-1, [facility]  Pt seen and examined Allergic reaction improving while at bedside. TVUS this am  Agree with resident's note.  Jemya Depierro H. 04/11/2015 8:02 AM

## 2015-04-11 NOTE — Progress Notes (Signed)
Pt verbalizes understanding of d/c instructions, medications, follow up appts, when to seek medical attention, and belongings policy. Pt has no questions at time of d/c. Pt was given a copy of d/c instructions, including her prescriptions. Pts IV was d/c by NT without complications. Pt d/c to main entrance accompanied by NT. Pts husband is meeting her there with the car and will be driving her home. Sheryn BisonGordon, Derius Ghosh Warner

## 2015-04-11 NOTE — Discharge Instructions (Signed)
Postpartum Hemorrhage °Postpartum hemorrhage is excessive blood loss after childbirth. Some blood loss is normal after delivering a baby. However, postpartum hemorrhage is a potentially serious condition.  °CAUSES  °· A loss of muscle tone in the uterus after childbirth. °· Failure to deliver all of the placenta. °· Wounds in the birth canal caused by delivery of the fetus. °· A maternal bleeding disorder that prevents blood clotting (rare). °RISK FACTORS °You are at greater risk for postpartum hemorrhage if you: °· Have a history of postpartum hemorrhage. °· Have delivered more than one baby. °· Had preeclampsia or eclampsia. °· Had problems with the placenta. °· Had complications during your labor or delivery. °· Are obese. °· Are Asian or Hispanic. °SIGNS AND SYMPTOMS  °Vaginal bleeding after delivery is normal and should be expected. Bleeding (lochia) will occur for several days after childbirth. This can be expected with normal vaginal deliveries and cesarean deliveries.  °You are bleeding too much after your delivery if you are: °· Passing large clots or pieces of tissue. This may be small pieces of placenta left after delivery.   °· Soaking more than one sanitary pad per hour for several hours.   °· Having heavy, bright-red bleeding that occurs 4 days or more after delivery.   °· Having a discharge that has a bad smell or if you begin to run an unexplained fever.   °· Having times of lightheadedness or fainting, feeling short of breath, or having your heart beat fast with very little activity.   °DIAGNOSIS  °A diagnosis is based on your symptoms and a physical exam of your perineum, vagina, cervix, and uterus. Diagnostic tests may include: °· Blood pressure and pulse. °· Blood tests. °· Blood clotting tests. °· Ultrasonography. °TREATMENT °· Treatment is based on the severity of bleeding and may include: °¨ Uterine massage. °¨ Medicines. °¨ Blood transfusions. °· Sometimes bleeding occurs if portions of the  placenta are left behind in the uterus after delivery. If this happens, often a curettage or scraping of the inside of the uterus must be done. This usually stops the bleeding. If this treatment does not stop the bleeding, surgery (hysterectomy) may have to be performed to remove the uterus. °· If bleeding is due to clotting or bleeding problems that are not related to the pregnancy, other treatments may be needed.   °HOME CARE INSTRUCTIONS  °· Limit your activity as directed by your health care provider. Your health care provider may order bed rest (getting up to the bathroom only) or may allow you to continue light activity.   °· Keep track of the number of pads you use each day and how soaked (saturated) they are. Write this number down.   °· Do not use tampons. Do not douche or have sexual intercourse until approved by your health care provider.   °· Drink enough fluids to keep your urine clear or pale yellow.   °· Get proper amounts of rest.   °· Eat foods that are rich in iron, such as spinach, red meat, and legumes.   °SEEK IMMEDIATE MEDICAL CARE IF: °· You experience severe cramps in your stomach, back, or belly (abdomen).   °· You have a fever.   °· You pass large clots or tissue. Save any tissue for your health care provider to look at.   °· Your bleeding increases. °· You become weak or lightheaded, or you pass out.   °· Your sanitary pad count per hour is increasing. °MAKE SURE YOU: °· Understand these instructions. °· Will watch your condition. °· Will get help right away if   you are not doing well or get worse. °  °This information is not intended to replace advice given to you by your health care provider. Make sure you discuss any questions you have with your health care provider. °  °Document Released: 04/14/2003 Document Revised: 01/27/2013 Document Reviewed: 07/10/2012 °Elsevier Interactive Patient Education ©2016 [employer] ° °

## 2015-04-11 NOTE — H&P (Signed)
Andrea Hanson is an 35 y.o. female presenting with heavy vaginal bleeding x1 day. S/p NSVD at [facility] center on 2/19.  Vaginal bleeding and passage of large blood clots began on 3/5. Bleeding was very heavy to the point where she went through 3-4 hospital pads in 1 hour and passed multiple "golfball" sized clots. Developed dizziness, lightheadedness, nausea, and pelvic cramping. Denies sex or any intravaginal item placement in last week. Pt went to [facility] ED. She was tachycardic and noted to have blood clots in the vault. 60 cc of blood was suctioned. Her hgb was 7. 2 units of pRBCs was ordered and OB was consulted.   Pertinent Gynecological History: Menses: has not had menses since SVD on 2/19 Bleeding: Secondary PPH currently Contraception: none DES exposure: unknown Blood transfusions: s/p 2 pRBC Sexually transmitted diseases: no past history Previous GYN Procedures: unknown  Last mammogram: unknown  Last pap: unknown  OB History: G3, P3  Menstrual History: Menarche age: unknown No LMP recorded.    Past Medical History  Diagnosis Date  . Environmental allergies   . Depression 2012    PP. TOOK MEDS  . Anxiety 2012  . Abnormal Pap smear 2011    REPEATED; LAST PAP 02/2010  . Asthma     CHILDHOOD  . Infection     UTI X1  . Infection     yeast x1  . Anemia     CHRONIC    Past Surgical History  Procedure Laterality Date  . Bartholin gland cyst excision    . No past surgeries      Family History  Problem Relation Age of Onset  . Depression Mother   . Diabetes Mother   . Hypertension Mother   . Alcohol abuse Father   . Cirrhosis Father   . Hypertension Father   . Asthma Sister   . Diabetes Sister   . Diabetes Maternal Aunt   . Hypertension Maternal Aunt   . Diabetes Maternal Grandmother   . Cancer Maternal Aunt 43    BREAST  . Diabetes Maternal Aunt     Social History:  reports that she quit smoking about 6 years ago. Her smoking use included  Cigarettes. She has never used smokeless tobacco. She reports that she drinks about 7.0 - 12.0 oz of alcohol per week. She reports that she uses illicit drugs (Marijuana).  Allergies:  Allergies  Allergen Reactions  . Betadine [Povidone Iodine] Hives    Irritant contact dermatitis with epidermal necrosis resembling TENS  . Shellfish Allergy Anaphylaxis  . Pollen Extract Other (See Comments)    Seasonal allergy    Prescriptions prior to admission  Medication Sig Dispense Refill Last Dose  . ibuprofen (ADVIL,MOTRIN) 800 MG tablet Take 800 mg by mouth every 8 (eight) hours as needed for moderate pain.   04/10/2015 at Unknown time  . Prenatal MV-Min-Fe Fum-FA-DHA (VITAFOL-OB+DHA) 65-1 & 250 MG MISC Take 1 tablet by mouth daily. 30 each 12 04/09/2015 at Unknown time  . acetaminophen (TYLENOL) 500 MG tablet Take 1,000 mg by mouth daily as needed. For pain   unknown  . EPINEPHrine (EPIPEN) 0.3 mg/0.3 mL DEVI Inject 0.3 mLs (0.3 mg total) into the muscle once. (Patient not taking: Reported on 04/10/2015) 1 Device 1 Not Taking at Unknown time  . ranitidine (ZANTAC) 150 MG tablet Take 1 tablet (150 mg total) by mouth at bedtime as needed for heartburn. 30 tablet 2 unknown    Review of Systems  Constitutional: Positive for  chills. Negative for fever and malaise/fatigue.  HENT: Negative for nosebleeds and sore throat.   Eyes: Negative for blurred vision.  Respiratory: Negative for cough and shortness of breath.   Cardiovascular: Negative for chest pain, palpitations, orthopnea, claudication and leg swelling.  Gastrointestinal: Positive for nausea and abdominal pain (pelvic). Negative for heartburn, vomiting, diarrhea, constipation and blood in stool.  Genitourinary: Negative for dysuria.  Musculoskeletal: Negative for myalgias.  Skin: Negative for rash.  Neurological: Positive for dizziness and headaches. Negative for tingling and weakness.  Endo/Heme/Allergies: Does not bruise/bleed easily.     Blood pressure 121/76, pulse 98, temperature 99 F (37.2 C), temperature source Oral, resp. rate 18, SpO2 100 %, unknown if currently breastfeeding. Physical Exam  Constitutional: She is oriented to person, place, and time. She appears well-developed and well-nourished.  HENT:  Head: Normocephalic and atraumatic.  Mouth/Throat: No oropharyngeal exudate.  Eyes: Conjunctivae are normal. Pupils are equal, round, and reactive to light.  Neck: Normal range of motion.  Cardiovascular: Normal rate, regular rhythm, normal heart sounds and intact distal pulses.   No murmur heard. Cap refill < 2 seconds  Respiratory: Effort normal and breath sounds normal. No respiratory distress.  GI: Soft. Bowel sounds are normal. She exhibits no distension and no mass. There is tenderness (suprapubic). There is no rebound.  Genitourinary: Vagina normal.  Scant blood on pad   Musculoskeletal: Normal range of motion. She exhibits no edema or tenderness.  Neurological: She is alert and oriented to person, place, and time.  Skin: Skin is warm and dry. No erythema.  Psychiatric: She has a normal mood and affect. Her behavior is normal. Judgment and thought content normal.    Results for orders placed or performed during the hospital encounter of 04/10/15 (from the past 24 hour(s))  Wet prep, genital     Status: Abnormal   Collection Time: 04/10/15  8:03 PM  Result Value Ref Range   Yeast Wet Prep HPF POC NONE SEEN NONE SEEN   Trich, Wet Prep NONE SEEN NONE SEEN   Clue Cells Wet Prep HPF POC NONE SEEN NONE SEEN   WBC, Wet Prep HPF POC FEW (A) NONE SEEN   Sperm NONE SEEN   Type and screen     Status: None (Preliminary result)   Collection Time: 04/10/15  8:23 PM  Result Value Ref Range   ABO/RH(D) O POS    Antibody Screen NEG    Sample Expiration 04/13/2015    Unit Number [id number]    Blood Component Type RED CELLS,LR    Unit division 00    Status of Unit ALLOCATED    Transfusion Status OK TO  TRANSFUSE    Crossmatch Result Compatible    Unit Number [id number]    Blood Component Type RED CELLS,LR    Unit division 00    Status of Unit ISSUED    Transfusion Status OK TO TRANSFUSE    Crossmatch Result Compatible   ABO/Rh     Status: None   Collection Time: 04/10/15  8:23 PM  Result Value Ref Range   ABO/RH(D) O POS   CBC with Differential     Status: Abnormal   Collection Time: 04/10/15  8:24 PM  Result Value Ref Range   WBC 9.5 4.0 - 10.5 K/uL   RBC 3.30 (L) 3.87 - 5.11 MIL/uL   Hemoglobin 9.6 (L) 12.0 - 15.0 g/dL   HCT 46.928.5 (L) 62.936.0 - 52.846.0 %   MCV 86.4 78.0 - 100.0 fL  MCH 29.1 26.0 - 34.0 pg   MCHC 33.7 30.0 - 36.0 g/dL   RDW 40.9 (H) 81.1 - 91.4 %   Platelets 292 150 - 400 K/uL   Neutrophils Relative % 64 %   Neutro Abs 6.1 1.7 - 7.7 K/uL   Lymphocytes Relative 28 %   Lymphs Abs 2.6 0.7 - 4.0 K/uL   Monocytes Relative 7 %   Monocytes Absolute 0.7 0.1 - 1.0 K/uL   Eosinophils Relative 1 %   Eosinophils Absolute 0.1 0.0 - 0.7 K/uL   Basophils Relative 0 %   Basophils Absolute 0.0 0.0 - 0.1 K/uL  Basic metabolic panel     Status: Abnormal   Collection Time: 04/10/15  8:24 PM  Result Value Ref Range   Sodium 134 (L) 135 - 145 mmol/L   Potassium 3.3 (L) 3.5 - 5.1 mmol/L   Chloride 100 (L) 101 - 111 mmol/L   CO2 23 22 - 32 mmol/L   Glucose, Bld 97 65 - 99 mg/dL   BUN 8 6 - 20 mg/dL   Creatinine, Ser 7.82 0.44 - 1.00 mg/dL   Calcium 8.7 (L) 8.9 - 10.3 mg/dL   GFR calc non Af Amer >60 >60 mL/min   GFR calc Af Amer >60 >60 mL/min   Anion gap 11 5 - 15  I-Stat Chem 8, ED     Status: Abnormal   Collection Time: 04/10/15  8:33 PM  Result Value Ref Range   Sodium 135 135 - 145 mmol/L   Potassium 3.3 (L) 3.5 - 5.1 mmol/L   Chloride 98 (L) 101 - 111 mmol/L   BUN 6 6 - 20 mg/dL   Creatinine, Ser 9.56 0.44 - 1.00 mg/dL   Glucose, Bld 97 65 - 99 mg/dL   Calcium, Ion 2.13 0.86 - 1.23 mmol/L   TCO2 21 0 - 100 mmol/L   Hemoglobin 11.2 (L) 12.0 - 15.0 g/dL   HCT  57.8 (L) 46.9 - 46.0 %  Urine rapid drug screen (hosp performed)     Status: Abnormal   Collection Time: 04/10/15  9:38 PM  Result Value Ref Range   Opiates POSITIVE (A) NONE DETECTED   Cocaine NONE DETECTED NONE DETECTED   Benzodiazepines NONE DETECTED NONE DETECTED   Amphetamines NONE DETECTED NONE DETECTED   Tetrahydrocannabinol POSITIVE (A) NONE DETECTED   Barbiturates NONE DETECTED NONE DETECTED  POC Urine Pregnancy, ED (do NOT order at [facility])     Status: Abnormal   Collection Time: 04/10/15  9:56 PM  Result Value Ref Range   Preg Test, Ur POSITIVE (A) NEGATIVE  I-Stat Beta hCG blood, ED ([facility], [facility], [facility] only)     Status: Abnormal   Collection Time: 04/10/15 10:17 PM  Result Value Ref Range   I-stat hCG, quantitative 6.6 (H) <5 mIU/mL   Comment 3          CBC     Status: Abnormal   Collection Time: 04/10/15 11:06 PM  Result Value Ref Range   WBC 7.4 4.0 - 10.5 K/uL   RBC 2.36 (L) 3.87 - 5.11 MIL/uL   Hemoglobin 7.0 (L) 12.0 - 15.0 g/dL   HCT 62.9 (L) 52.8 - 41.3 %   MCV 86.0 78.0 - 100.0 fL   MCH 29.7 26.0 - 34.0 pg   MCHC 34.5 30.0 - 36.0 g/dL   RDW 24.4 (H) 01.0 - 27.2 %   Platelets 191 150 - 400 K/uL  Prepare RBC     Status: None  Collection Time: 04/11/15 12:06 AM  Result Value Ref Range   Order Confirmation ORDER PROCESSED BY BLOOD BANK   hCG, quantitative, pregnancy     Status: Abnormal   Collection Time: 04/11/15 12:07 AM  Result Value Ref Range   hCG, Beta Chain, Quant, S 6 (H) <5 mIU/mL    US Pelvis Complete  04/10/2015  CLINICAL DATA:  Patient delivered 2 weeks ago and now is having bleeding with large clots. EXAM: TRANSABDOMINAL ULTRASOUND OF PELVIS TECHNIQUE: Transabdominal ultrasound examination of the pelvis was performed including evaluation of the uterus, ovaries, adnexal regions, and pelvic cul-de-sac. COMPARISON:  None. FINDINGS: Uterus Measurements: 11.7 x 5.8 x 6.9 cm. Uterus is anteverted. No fibroids or other mass visualized. Endometrium Thickness: 28 mm.  Diffusely thickened with heterogeneous appearance. Internal echogenic material and fluid present. Limited color flow Doppler imaging is obtained demonstrating no significant flow within the endometrium. This suggests hemorrhage or blood clots more likely than retained products of conception. Right ovary Right ovary is not visualized. Left ovary Measurements: 2.7 x 3.2 x 2.7 cm. Normal appearance/no adnexal mass. Other findings:  No abnormal free fluid. IMPRESSION: Thickened and heterogeneous appearance of the endometrium without color flow suggesting hemorrhage or blood clots more likely than retained products of conception. Electronically Signed   By: Burman Nieves M.D.   On: 04/10/2015 23:02    Assessment/Plan: Andrea Hanson is a 35 yo G3P3 who presents with anemia secondary to heavy vaginal bleeding x 1 day. S/p NSVD on 2/19 to an infant with gastroschisis who is in NICU. Awaiting medical records from [place] for more information. PMH of anemia.   Secondary post partum hemorrhage: Likely due to uterine subinvolution or atony. Unlikely infectious cause of bleeding as patient has been afebrile and WBC WNL although she does have pelvic tenderness on exam. Unlikely retained products as pelvic U/Sshows thickening but no retained products and beta HCG only slightly elevated at 6. Unlikely coagulopathy as patient has no hx of blood disorder or excessive bleeding.  - Admit to inpatient OB, attending Dr. Penne Lash - s/p Premarin (estrogen) at 0026. Consider giving q 6 hours if bleeding persists - Monitor bleeding and amount of pads changed - Vital signs per floor protocol - Maintenance IVF - Motrin PRN for pain  Anemia due to blood loss: VSS currently and dizziness has subsided. Hgb of 7 at [facility] ED. S/p 2 units pRBC.  - Monitor for symptoms of dizziness, lightheadedness, headache - F/u post transfusion hgb/hct - Will transfuse 2 more units if hgb drops - Daily CBC - Vital signs as above  Beaulah Dinning 04/11/2015, 2:22 AM   Pt seen and examined.  Reviewed procedures from ED.  Verbal report from ED physician was that endometrium was thin with no retained POC on transvaginal US.  Endometrium is 2.8 cm and [place] was not transvaginal.  Will add TV US to get better look at endometrium and make sure there is no retained POC.  Report of initial presentation with heavy onset vaginal bleeding, POC could have been the culprit.    Still unable to see pts delivery from [facility].  Will ask MFM in am to look her up on there system as she was there patient.   Bleeding slowed.  No more medications until [place] is back.  NPO.  Kentley Cedillo H.

## 2015-04-11 NOTE — ED Notes (Signed)
[facility] has arrived  

## 2015-04-12 LAB — BETA HCG QUANT (REF LAB): BETA HCG, TUMOR MARKER: 5 m[IU]/mL

## 2015-04-14 LAB — TYPE AND SCREEN
ABO/RH(D): O POS
Antibody Screen: NEGATIVE
UNIT DIVISION: 0
Unit division: 0

## 2015-06-13 ENCOUNTER — Ambulatory Visit (HOSPITAL_COMMUNITY): Payer: Self-pay | Admitting: Psychiatry

## 2015-06-15 ENCOUNTER — Ambulatory Visit (HOSPITAL_COMMUNITY): Payer: Self-pay | Admitting: Clinical
